# Patient Record
Sex: Female | Born: 1995 | Race: Black or African American | Marital: Single | Smoking: Never smoker
Health system: Northeastern US, Academic
[De-identification: ages and names within clinical notes are randomized; demographics above are authoritative.]

## PROBLEM LIST (undated history)

## (undated) DIAGNOSIS — F419 Anxiety disorder, unspecified: Secondary | ICD-10-CM

## (undated) DIAGNOSIS — F32A Depression, unspecified: Secondary | ICD-10-CM

## (undated) DIAGNOSIS — D649 Anemia, unspecified: Secondary | ICD-10-CM

## (undated) HISTORY — DX: Anxiety disorder, unspecified: F41.9

## (undated) HISTORY — DX: Depression, unspecified: F32.A

---
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
## 2015-01-09 ENCOUNTER — Emergency Department
Admission: EM | Admit: 2015-01-09 | Disposition: A | Discharge status: Home / Self Care | Payer: Self-pay | Source: Ambulatory Visit | Attending: Emergency Medicine | Admitting: Emergency Medicine

## 2015-01-09 ENCOUNTER — Encounter: Payer: Self-pay | Admitting: Emergency Medicine

## 2015-01-09 LAB — PLASMA PROF 7 (ED ONLY)
Anion Gap,PL: 20 — ABNORMAL HIGH (ref 7–16)
CO2,Plasma: 20 mmol/L (ref 20–28)
Chloride,Plasma: 103 mmol/L (ref 96–108)
Creatinine: 0.78 mg/dL (ref 0.50–1.00)
GFR,Black: 127 *
GFR,Caucasian: 110 *
Glucose,Plasma: 115 mg/dL — ABNORMAL HIGH (ref 60–99)
Potassium,Plasma: 3.5 mmol/L (ref 3.4–4.7)
Sodium,Plasma: 143 mmol/L (ref 133–145)
UN,Plasma: 9 mg/dL (ref 6–20)

## 2015-01-09 LAB — CBC AND DIFFERENTIAL
Baso # K/uL: 0 10*3/uL (ref 0.0–0.1)
Basophil %: 0 %
Eos # K/uL: 0.1 10*3/uL (ref 0.0–0.4)
Eosinophil %: 0.9 %
Hematocrit: 40 % (ref 34–45)
Hemoglobin: 13.5 g/dL (ref 11.2–15.7)
Lymph # K/uL: 7.2 10*3/uL — ABNORMAL HIGH (ref 1.2–3.7)
Lymphocyte %: 50 %
MCH: 29 pg/cell (ref 26–32)
MCHC: 34 g/dL (ref 32–36)
MCV: 85 fL (ref 79–95)
Mono # K/uL: 0.6 10*3/uL (ref 0.2–0.9)
Monocyte %: 4.3 %
Neut # K/uL: 4.8 10*3/uL (ref 1.6–6.1)
Nucl RBC # K/uL: 0 10*3/uL (ref 0.0–0.0)
Nucl RBC %: 0 /100 WBC (ref 0.0–0.2)
Platelets: 231 10*3/uL (ref 160–370)
RBC: 4.7 MIL/uL (ref 3.9–5.2)
RDW: 12.6 % (ref 11.7–14.4)
Seg Neut %: 37.9 %
WBC: 12.7 10*3/uL — ABNORMAL HIGH (ref 4.0–10.0)

## 2015-01-09 LAB — DIFF MANUAL
Diff Based On: 116 CELLS
React Lymph %: 7 % — ABNORMAL HIGH (ref 0–6)

## 2015-01-09 LAB — ETHANOL: Ethanol: 243 mg/dL

## 2015-01-09 MED ORDER — SODIUM CHLORIDE 0.9 % IV BOLUS *I*
1000.0000 mL | Freq: Once | Status: AC
Start: 2015-01-09 — End: 2015-01-09
  Administered 2015-01-09: 1000 mL via INTRAVENOUS

## 2015-01-09 NOTE — ED Provider Notes (Addendum)
History     Chief Complaint   Patient presents with    Unresponsive    Alcohol Intoxication     HPI Comments: Nichole Carter is a 19 y.o.  female with no pertinent past medical history who presents in the company of a friend who says that she drank a Four Loco, which may have been spiked with additional vodka.  She was initially brought to the critical care bay because she had a systolic blood pressure in the 70s on arrival.  The patient arrives vomiting.  She is minimally responsive.  She is protecting her airway.  She is moving all extremities equally.      History provided by:  EMS personnel  Language interpreter used: No    Is this ED visit related to civilian activity for income:  Not work related    History reviewed. No pertinent past medical history.     History reviewed. No pertinent past surgical history.  Family History   Problem Relation Age of Onset    Diabetes Mother     No Known Problems Father     High cholesterol Paternal Aunt     Diabetes Paternal Aunt        Social History    reports that she has never smoked. She does not have any smokeless tobacco history on file. She reports that she currently engages in sexual activity and has had female partners. She reports using the following methods of birth control/protection: Condom and Injection. She reports that she does not drink alcohol or use illicit drugs.    Living Situation     Questions Responses    Patient lives with Family    Homeless     Caregiver for other family member     External Services     Employment     Domestic Violence Risk           Problem List     Patient Active Problem List   Diagnosis Code    Refractive Error H52.6    Depo-Provera contraceptive status Z30.40       Review of Systems   Review of Systems   Unable to perform ROS: Mental status change       Physical Exam       ED Triage Vitals   BP Heart Rate Heart Rate (via Pulse Ox) Resp Temp Temp src SpO2 O2 Device O2 Flow Rate   01/09/15 0201 01/09/15 0158 01/09/15 0225  01/09/15 0201 01/09/15 0158 01/09/15 0551 01/09/15 0225 01/09/15 0225 --   72/45 88 57 14 35.9 C (96.6 F) TEMPORAL 100 % None (Room air)       Weight           01/09/15 0158           54.4 kg (120 lb)               Physical Exam   Constitutional:   The patient appears intoxicated.  She is vomiting.  She moans when stimulated.   HENT:   Head: Normocephalic and atraumatic.   Eyes: Conjunctivae and EOM are normal.   Neck: Neck supple.   Cardiovascular: Regular rhythm.    Pulmonary/Chest: Effort normal.   Abdominal: She exhibits no distension.   Skin: Skin is warm and dry.   Nursing note and vitals reviewed.      Medical Decision Making        Initial Evaluation:  ED First Provider Contact     Date/Time Event  User Comments    01/09/15 0212 ED Provider First Contact Thermon LeylandNORTON, DYLAN Initial Face to Face Provider Contact        Patient seen by me on arrival date of 01/09/2015 at at time of arrival  1:55 AM.  Initial face to face evaluation time noted above may be discrepant due to patient acuity and delay in documentation.    Assessment:  19 y.o.female comes to the ED with likely alcohol intoxication.  She has no signs of trauma.  She does appear intoxicated, but is protecting her airway.  There has been some vomiting.  She was noted to initially be hypotensive at triage, but her blood pressure was repeated and her systolic is above 100.    Differential Diagnosis includes alcohol intoxication, electrolyte disorder, dehydration, hypoglycemia, anemia.                      Plan: ED 7, CBC, alcohol level.    Disposition: Will reassess when sober.    ED Course: Initial alcohol level 243. Pt slept comfortably through the night. She was reassessed in the AM. Pt is now clinically sober, alert, oriented. Denies depression / SI. Recalls events and speaking fluently. Pt to be discharged to home.       Galen Manilaylan L Norton, MD         Galen ManilaNorton, Dylan L, MD  Resident  01/09/15 [phone]    Resident Attestation:     Patient seen by me on arrival  date of 01/09/2015 at 0329    History:   I reviewed this patient, reviewed the resident's note and agree.  Exam:   I examined this patient, reviewed the resident's note and agree.    Decision Making:   I discussed with the resident his/her documented decision making  and agree.      Author Tobie PoetJoseph Shykeria Sakamoto, MD           Tobie PoetKonwinski, Burley [place]ka, MD  01/11/15 [phone]

## 2015-01-09 NOTE — ED Triage Notes (Signed)
Patient was at friends house and was drinking unknown quantity of alcohol. Patient MHA'd for intoxication. Unknown drug use.        Triage Note   [name], RN

## 2015-01-09 NOTE — ED Notes (Signed)
Patient verbalizes understanding of discharge instructions. Given a list of detox facilities. All questions/concerns addressed. IV removed, appropriate clothing in place, VSS, AOx4, tolerating PO. Patient ambulated out on own, stable while ambulatory. Has an appropriate ride home with family. Given access to MyChart so patient can review admission results and future appointments.

## 2015-01-09 NOTE — ED Notes (Signed)
Pt brought in by ems under MHA, reportedly consuming etoh/4 LOKO? ? Thc. Pt arrives moaning, making hissing sounds, spitting phlegm out on floor. Pt cold and wet , wrapped in a wet curtain, shirt/bra and 1 wet sock. Pt changed into warm gown and green socks and warm blankets

## 2015-01-09 NOTE — ED Notes (Signed)
Pt still lethargic; responding to questions with a growl or meow. When asked to extend arm for blood pressure, pt growled and writer had to extend arm for pt. Will continue to monitor and treat per orders.

## 2015-01-09 NOTE — ED Notes (Addendum)
Assumed care of pt at this time. Pt is curled up in a ball in bed in NAD, responds to questions and prods by meowing. Friend states that pt "likes to act like a cat when she is drunk by meowing and hissing". Per friend, pt drank one four loko but other friends "kept refilling the four loko with vodka". Pulse ox 98% on room air, BP 105/82. Will continue to monitor and treat per orders.

## 2015-01-09 NOTE — Discharge Instructions (Signed)
Chemical Dependency Resources      How can I tell if I have an alcohol problem?     Alcohol may be harmful for you if it causes a problem in any part of your life. Following are signs that you may have a drinking problem or are alcohol dependent.     Blacking out or forgetting where you were or what you were doing  Drinking to get drunk. Or, feeling like you need to drink more to get the same feeling or "buzz"  Drinking to decrease pain or stress  Drinking more than you had expected to drink  Drinking in a pattern. For example, every day or every week at the same time  Suffering from the effects of alcohol on your daily function  Drinking starts to take over and causes problems in your daily life, such as not showing up for work or driving when you are drunk  Trying to hide how much you drink  Feeling guilty or angry when someone says something about your drinking  Seeing or hearing things that are not there (hallucinating)  Having major personality changes when you drink  Planning activities around drinking  Experiencing seizures (convulsions)  Shaking of your hands if you have not had a drink for a while  Sleeping problems or bad dreams  Sweating, nervousness, confusion, or depression  Thinking a lot about drinking  Trouble having erections       What?s the harm?     Not all drinking is harmful. You may have heard that regular light to moderate drinking  (from ½ drink a day up to 1 drink a day for women and 2 for men) can even be good  for the heart. With at-risk or heavy drinking, however, any potential benefits are  outweighed by greater risks.     Injuries. Drinking too much increases your chances of being injured or even killed.  Alcohol is a factor, for example, in about 60% of fatal burn injuries, drownings, and  homicides; 50% of severe trauma injuries and sexual assaults; and 40% of fatal  motor vehicle crashes, suicides, and fatal falls.     Health problems. Heavy drinkers have a greater risk of liver  disease, heart disease,  sleep disorders, depression, stroke, bleeding from the stomach, sexually transmitted  infections from unsafe sex, and several types of cancer. They may also have problems  managing diabetes, high blood pressure, and other conditions.     Birth defects. Drinking during pregnancy can cause brain damage and other serious  problems in the baby. Because it is not yet known whether any amount of alcohol is  safe for a developing baby, women who are pregnant or may become pregnant should  not drink.     Alcohol use disorders. Generally known as alcoholism and alcohol abuse, alcohol  use disorders are medical conditions that doctors can diagnose when a patient?s  drinking causes distress or harm. In the [place], about 18 million people have  an alcohol use disorder.          Wellness Hints:     Be honest and open about your alcohol use with your family, close friends, and health care professionals. Ask for and accept their help.  Avoid persons who use and/or abuse alcohol and who try to get you to drink alcohol.  Get the help of a counselor and keep regular appointments.  Eat a normal, well-balanced diet, drink six to eight glasses of water a day, and   get plenty of rest.  Replace social activities associated with drinking alcohol with those that do not involve alcohol.  Consider cutting down on the amount of alcohol you drink and how often you drink it.     Resources for Drug and/or Alcohol Treatment   http://dasis3.samhsa.gov/          DETOX FACILITIES:     [facility] ([place])  [address]th floor) [place] [address]  [phone]   [facility] ([place]    [address] [place] [address]  [phone]   [facility] ([place] or Straight Medicaid only)  [address] [place] [address]  [phone]   [facility] ([facility], Alcohol Only)  [address] [place] [address]  [phone]    [facility] ([facility]  Outpatient Only, Alcohol Only)  [address] [place] [address]  [phone]    [facility]  [address] [place] [address]  [phone]      INPATIENT PROGRAMS:     [place]    [address] [place] [address] ([facility] [facility])  [phone]    [place]  [address] [place] [address]  ([phone] option [phone]   [facility]  [address] [place] [address]  (Facility is affiliated with [facility]. Referral must be sent to [facility] who will then forward information to this location.)  [phone]   [facility] ([facility] [place])  [address] [place] [address]  [phone]   [facility] ([place]) - [facility]-   [address] [place] [address]  Facility is affiliated with [facility]. Referral must be sent to [facility] who will then forward information to this location.)  [phone]   [facility]    [address] [place] [address] (located on [facility])  [phone]     [facility] ([facility])  [address] [place] [address]  [phone]     [facility] ([place])    [address] [place] [address]  [phone]            OUTPATIENT PROGRAMS:     [facility]  [address] [place] [address]  [phone]    [facility] ([place]  [address] [place] [address]  [phone]   [place] [facility]  [address] [place] [address] ([facility] [facility])  [address] [place] [address]  [phone]  [phone]   [facility] (Monday- Thursday)  [address] [place] [address]  [phone]   [facility]  [address] [place] [address]  [address] [place] [address] (Spanish speaking available)  [address] [place]  [phone]  (indicate location when calling main intake line)     [facility] - [facility]  [address] [place] [address]  [phone]   [facility] ([place])  [address] [place] [address]  [phone]   [facility] (@ [facility])  [address] [place]  [address]  [phone]   [facility]  [address] [place]  [address]  [phone]   [facility]   [address]    [place] [address]    [address] [place] [address]    [address] [place] [address] ([place])   [phone]  (indicate location when calling main intake line)    [facility]  [address] [place] [address]  [phone]         ADDITIONAL RESOURCES:     Al-Anon/Al-Teen  [phone]    Alcohol Abuse & Addiction Information  1-800-234-0420    Alcohol Anonymous  [phone]    Drug Abuse Information Line  1-800-522-5353    Drug and Alcohol Council  467-2230    LifeLine  275-5151 or 211    [place] [facility] on Alcohol & Substance Abuse ([place])  243-9210    Narcotics Anonymous Hotline  234-7889    [facility]  [phone] ext [phone]          VETERANS ADMINISTRATION:     [place]  [address] [place] [address]  [phone]     [place]  No CD Treatment    [place]  [address] [place] [address]  [phone]     [place]  [address] [place] [address]  [phone]     [place]  [address] [place] [address]  [phone]     [facility]  [address] [place] [address]  *only offers intake appointment if veteran is unable to be seen at another facility. Will refer out for treatment after intake is completed.   [phone]   [place]  No CD treatment     [facility]    [address] [place] [address]

## 2015-01-09 NOTE — ED Notes (Signed)
ED RN INTERN ATTESTATION       I Nichole CanaryJaime E Golden, RN (RN) reviewed the following charting information by the RN intern: Nichole Carter       Nursing Assessments  Medications  Plan of Care  Teaching   Notes    In the chart of Nichole Carter [age] y.o. female) and attest to the charting being accurate.

## 2015-01-12 ENCOUNTER — Emergency Department
Admission: EM | Admit: 2015-01-12 | Disposition: A | Discharge status: Home / Self Care | Payer: Self-pay | Source: Ambulatory Visit

## 2015-01-12 ENCOUNTER — Encounter: Payer: Self-pay | Admitting: Emergency Medicine

## 2015-01-12 LAB — POCT URINALYSIS DIPSTICK
Glucose,UA POCT: NORMAL
Ketones,UA POCT: NEGATIVE
Leuk Esterase,UA POCT: 1 — AB
Lot #: 11517302
Nitrite,UA POCT: POSITIVE — AB
PH,UA POCT: 7 (ref 5–8)

## 2015-01-12 LAB — URINE MICROSCOPIC (IQ200)

## 2015-01-12 LAB — POCT URINE PREGNANCY
Exp date: 3
Lot #: 119119

## 2015-01-12 LAB — VAGINITIS SCREEN: DNA PROBE: Vaginitis Screen:DNA Probe: POSITIVE

## 2015-01-12 LAB — URINALYSIS REFLEX TO CULTURE
Ketones, UA: NEGATIVE
Nitrite,UA: NEGATIVE
Protein,UA: 25 mg/dL — AB
Specific Gravity,UA: 1.014 (ref 1.001–1.030)
pH,UA: 7 (ref 5.0–8.0)

## 2015-01-12 LAB — HM HIV SCREENING OFFERED

## 2015-01-12 LAB — HIV 1&2 ANTIGEN/ANTIBODY: HIV 1&2 ANTIGEN/ANTIBODY: NONREACTIVE

## 2015-01-12 MED ORDER — AZITHROMYCIN 250 MG PO TABS *I*
1000.0000 mg | ORAL_TABLET | Freq: Once | ORAL | Status: AC
Start: 2015-01-12 — End: 2015-01-12
  Administered 2015-01-12: 1000 mg via ORAL
  Filled 2015-01-12: qty 4

## 2015-01-12 MED ORDER — LIDOCAINE HCL 1 % IJ SOLN *I*
INTRAMUSCULAR | Status: AC
Start: 2015-01-12 — End: 2015-01-12
  Administered 2015-01-12: 200 mg
  Filled 2015-01-12: qty 20

## 2015-01-12 MED ORDER — NITROFURANTOIN MONOHYD MACRO 100 MG PO CAPS *I*
100.0000 mg | ORAL_CAPSULE | Freq: Two times a day (BID) | ORAL | 0 refills | Status: AC
Start: 2015-01-12 — End: 2015-01-17

## 2015-01-12 MED ORDER — CEFTRIAXONE SODIUM 250 MG IJ SOLR *I*
250.0000 mg | Freq: Once | INTRAMUSCULAR | Status: AC
Start: 2015-01-12 — End: 2015-01-12
  Administered 2015-01-12: 250 mg via INTRAMUSCULAR
  Filled 2015-01-12: qty 250

## 2015-01-12 NOTE — Discharge Instructions (Signed)
Follow up with pcp    Stay hydrated    Take antibiotics as directed    You were evaluated today and had cultures sent to our lab for testing, which will take on average 48 hours to result. We are to notify you with positive findings. It is important that you do not have sexual intercourse or contact until you receive our results, and if positive, that your partner(s) is/are tested. If you would like to follow up on your own, you can access the information through MyChart or call [phone]). Your message will be returned within 24 hours.
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
## 2015-01-12 NOTE — ED Triage Notes (Signed)
States mid abd pain with n/v that began on 01-09-15. Also wants STD checks        Triage Note   Golden HurterJeffery S Betzabeth Derringer, RN

## 2015-01-13 LAB — CHLAMYDIA PLASMID DNA AMPLIFICATION: Chlamydia Plasmid DNA Amplification: 0

## 2015-01-13 LAB — N. GONORRHOEAE DNA AMPLIFICATION: N. gonorrhoeae DNA Amplification: 0

## 2015-01-13 LAB — AEROBIC CULTURE

## 2015-01-26 ENCOUNTER — Ambulatory Visit: Payer: Self-pay | Admitting: Family Medicine

## 2015-02-09 ENCOUNTER — Ambulatory Visit: Payer: Self-pay | Admitting: Family Medicine

## 2015-03-14 ENCOUNTER — Emergency Department
Admission: EM | Admit: 2015-03-14 | Disposition: A | Discharge status: Home / Self Care | Payer: Self-pay | Source: Ambulatory Visit

## 2015-03-14 ENCOUNTER — Encounter: Payer: Self-pay | Admitting: Emergency Medicine

## 2015-03-14 LAB — POCT URINALYSIS DIPSTICK
Glucose,UA POCT: NORMAL
Ketones,UA POCT: NEGATIVE
Leuk Esterase,UA POCT: NEGATIVE
Lot #: 17822902
Nitrite,UA POCT: NEGATIVE
PH,UA POCT: 5 (ref 5–8)

## 2015-03-14 LAB — BASIC METABOLIC PANEL
Anion Gap: 13 (ref 7–16)
CO2: 25 mmol/L (ref 20–28)
Calcium: 9.4 mg/dL (ref 9.0–10.4)
Chloride: 104 mmol/L (ref 96–108)
Creatinine: 0.86 mg/dL (ref 0.50–1.00)
GFR,Black: 113 *
GFR,Caucasian: 98 *
Glucose: 106 mg/dL — ABNORMAL HIGH (ref 60–99)
Lab: 12 mg/dL (ref 6–20)
Potassium: 4 mmol/L (ref 3.3–5.1)
Sodium: 142 mmol/L (ref 133–145)

## 2015-03-14 LAB — BLOOD BANK HOLD LAVENDER

## 2015-03-14 LAB — RUQ PANEL (ED ONLY)
ALT: 17 U/L (ref 0–35)
AST: 21 U/L (ref 0–35)
Albumin: 4.4 g/dL (ref 3.5–5.2)
Alk Phos: 94 U/L (ref 50–130)
Amylase: 113 U/L — ABNORMAL HIGH (ref 28–100)
Bilirubin,Direct: <0.2 mg/dL (ref 0.0–0.3)
Bilirubin,Total: 0.4 mg/dL (ref 0.0–1.2)
Globulin: 2.7 g/dL (ref 2.7–4.3)
Lipase: 28 U/L (ref 13–60)
Total Protein: 7.1 g/dL (ref 6.3–7.7)

## 2015-03-14 LAB — CBC AND DIFFERENTIAL
Baso # K/uL: 0 10*3/uL (ref 0.0–0.1)
Basophil %: 0.3 %
Eos # K/uL: 0.1 10*3/uL (ref 0.0–0.4)
Eosinophil %: 0.9 %
Hematocrit: 44 % (ref 34–45)
Hemoglobin: 14.4 g/dL (ref 11.2–15.7)
IMM Granulocytes #: 0 10*3/uL (ref 0.0–0.1)
IMM Granulocytes: 0.3 %
Lymph # K/uL: 2.6 10*3/uL (ref 1.2–3.7)
Lymphocyte %: 38.3 %
MCH: 28 pg (ref 26–32)
MCHC: 33 g/dL (ref 32–36)
MCV: 85 fL (ref 79–95)
Mono # K/uL: 0.4 10*3/uL (ref 0.2–0.9)
Monocyte %: 6.3 %
Neut # K/uL: 3.6 10*3/uL (ref 1.6–6.1)
Nucl RBC # K/uL: 0 10*3/uL (ref 0.0–0.0)
Nucl RBC %: 0 /100 WBC (ref 0.0–0.2)
Platelets: 236 10*3/uL (ref 160–370)
RBC: 5.2 MIL/uL (ref 3.9–5.2)
RDW: 12.7 % (ref 11.7–14.4)
Seg Neut %: 53.9 %
WBC: 6.7 10*3/uL (ref 4.0–10.0)

## 2015-03-14 LAB — PREGNANCY TEST, SERUM: Preg,Serum: NEGATIVE

## 2015-03-14 MED ORDER — OMEPRAZOLE 20 MG PO CPDR *I*
20.0000 mg | DELAYED_RELEASE_CAPSULE | Freq: Every day | ORAL | 0 refills | Status: DC
Start: 2015-03-14 — End: 2015-11-01

## 2015-03-14 MED ORDER — KETOROLAC TROMETHAMINE 30 MG/ML IJ SOLN *I*
15.0000 mg | Freq: Once | INTRAMUSCULAR | Status: AC
Start: 2015-03-14 — End: 2015-03-14
  Administered 2015-03-14: 15 mg via INTRAVENOUS
  Filled 2015-03-14: qty 1

## 2015-03-14 MED ORDER — ACETAMINOPHEN 500 MG PO TABS *I*
500.0000 mg | ORAL_TABLET | Freq: Four times a day (QID) | ORAL | 0 refills | Status: AC | PRN
Start: 2015-03-14 — End: 2015-04-13

## 2015-03-14 MED ORDER — FAMOTIDINE (PF) 20 MG/2ML IV SOLN *I*
20.0000 mg | Freq: Once | INTRAVENOUS | Status: AC
Start: 2015-03-14 — End: 2015-03-14
  Administered 2015-03-14: 20 mg via INTRAVENOUS
  Filled 2015-03-14: qty 2

## 2015-03-14 MED ORDER — ALUM & MAG HYDROXIDE-SIMETH 200-200-20 MG/5ML PO SUSP *I*
30.0000 mL | Freq: Once | ORAL | Status: AC
Start: 2015-03-14 — End: 2015-03-14
  Administered 2015-03-14: 30 mL via ORAL
  Filled 2015-03-14: qty 30

## 2015-03-14 NOTE — Discharge Instructions (Signed)
You were seen in the ED for abdominal pain.  No acute findings during ED evaluation.    You should follow up with a provider from the list below.    You should continue to take your home medications as prescribed.    Return to the ED should you have any further acute medical problems or any worsening symptoms.    Should you have any further questions about your care or evaluation you should call or make an appointment with your primary physician to review them.    If you do not have a primary care doctor, the following physicians are currently accepting new patients:    [facility]    [facility] [place]  [address]  [phone]  Bertis Ruddyichard Dent, MD (FM)  Accepting Pediatric Patients Only  Wyline MoodKatherine Eisenberg, MD, PhD [facility](FM)  Accepting Pediatric and Obstetric Patients Only  Quentin AngstScott Hartman, MD (FM, [place])  Accepting Patients of All Ages  Carole CivilAssaf Yosha, MD ([place], [place])  Accepting Pediatric and Obstetric Patients Only    [facility]  [address], [address]  [phone]  Alfredia FergusonAlexandria Harmon, MD (Peds)     [facility]  [address]  [phone]  Nydia BoutonMichael Gavin, MD (IM/Peds)  Accepting Pediatric Patients Only  Emilio MathAnne Ryan, MD (MP)  Juliet RudeNicole Vavrina, DO (FM)    [place]    [facility]  [address], [address]  [phone]  Madison HickmanKathleen Donahue, MD (FM)   Accepting Pediatric Patients Only  Delanna Ahmadiarla Schwartz, MD (IM/Peds)  Margreta JourneyMarianne Taylor, MD (IM/Peds)  Accepting Pediatric Patients Only                     [facility]  [address]  [phone]  Accepting Pediatric Patients Only    [facility]  [address]  [phone]    [facility]  [address], [address]  [phone]  Dondra Spryebecca Lavender, MD (FM)  Marin OlpKarolina Lis-Hyjek, MD (FM)  Lavonne ChickStephen Lurie, MD, PhD (FM)  Sindy GuadeloupeNatercia Rodrigues, MD (FM)  Jeanmarie Plantobbyn Upham, MD [facility](FM)    [facility]  [address]  [phone]  Kenton KingfisherJames Alexander, MD (FM)  Carmin RichmondSurag Patel, MD (FM)  Dolphus Jennyraig Sillick, MD (FM)  Courtney HeysAngela St. Clair, DO [facility](FM)    [facility]  [address]   [phone]  Jacqulyn DuckingJesse Schenendorf, MD (IM)    [facility]  [address]  Ambulatory Care Facility - 5th floor  [phone]    [facility]    [facility]  [address]  [phone]  Valeta HarmsSasha Nelson, MD (IM/Peds)  Accepting Pediatric Patients Only   [facility]   [place]  [address], [address]   [phone]  Janeal Holmesrew Emerson, MD (FM)    [facility]   [place]  [address]  [phone]  Josefine ClassScott Wilson, MD (FM)    [facility]  [phone]   [phone]  Eustace QuailAdrian Ashdown, MD (FM)    [facility]     [facility]   [address]   [phone]   Yolanda MangesAlex Fahoury, MD (IM)     [facility]  [address]  [phone]  Rosetta Posner, MD (FM)  Norvel Richards, DO [facility])  Royanne Foots, DO ([place])  Charlesetta Shanks, MD [facility])    [place]  [phone]   Didem Miraloglu, MD (IM)       FM: Family Medicine   IM: Internal Medicine  IM/Peds: Internal Medicine & Pediatrics  MP: Family Medicine & Pediatrics  OB Obstetrics  Peds: Pediatrics   For physician referral services, please call [phone] [email or web address]

## 2015-03-14 NOTE — ED Provider Notes (Signed)
History     Chief Complaint   Patient presents with    Abdominal Pain     Pt presents to the ED with c/o Mid epigastric pain, pt states "it feels like a punch, or burning" x3 days. denies any N/V/D.     HPI Comments: 20 year old female presents to the ED with complaints of abdominal pain.  Patient states for the last 3 days she's been having upper abdominal pain.  Patient has been constant.  Describes it as a burning pain.  Poor appetite due to pain.  Patient denies fevers, chills, nausea, vomiting, diarrhea.  Patient states that she is currently on her menstrual cycle.      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     No past surgical history on file.  Family History   Problem Relation Age of Onset    Diabetes Mother     No Known Problems Father     High cholesterol Paternal Aunt     Diabetes Paternal Aunt        Social History    reports that she has never smoked. She does not have any smokeless tobacco history on file. She reports that she drinks alcohol. She reports that she currently engages in sexual activity and has had female partners. She reports using the following methods of birth control/protection: Condom and Injection. She reports that she does not use illicit drugs.    Living Situation     Questions Responses    [facility]    Homeless     Caregiver for other family member     External Services     Employment     Domestic Violence Risk           Problem List     Patient Active Problem List   Diagnosis Code    Refractive Error H52.6    Depo-Provera contraceptive status Z30.40       Review of Systems   Review of Systems   Constitutional: Negative for chills and fever.   HENT: Negative for congestion.    Eyes: Negative for visual disturbance.   Respiratory: Negative for cough and shortness of breath.    Cardiovascular: Negative for chest pain.    Gastrointestinal: Positive for abdominal pain. Negative for blood in stool, diarrhea, nausea and vomiting.   Genitourinary: Positive for vaginal bleeding. Negative for dysuria, frequency and hematuria.   Musculoskeletal: Negative for arthralgias and back pain.   Neurological: Negative for dizziness, weakness, light-headedness, numbness and headaches.   All other systems reviewed and are negative.      Physical Exam     ED Triage Vitals   BP Heart Rate Heart Rate (via Pulse Ox) Resp Temp Temp src SpO2 O2 Device O2 Flow Rate   03/14/15 2040 03/14/15 2040 -- 03/14/15 2040 03/14/15 2040 03/14/15 2040 03/14/15 2040 -- --   111/61 80  18 36 C (96.8 F) TEMPORAL 97 %        Weight           03/14/15 2040           56.7 kg (125 lb)                    Physical Exam   Constitutional: She is oriented to person, place, and time.  She appears well-developed and well-nourished.   HENT:   Head: Normocephalic and atraumatic.   Mouth/Throat: Oropharynx is clear and moist. No oropharyngeal exudate.   Eyes: Conjunctivae are normal. Pupils are equal, round, and reactive to light.   Neck: Normal range of motion. Neck supple.   Cardiovascular: Normal rate, regular rhythm and normal heart sounds.    No murmur heard.  Pulmonary/Chest: Effort normal and breath sounds normal. No respiratory distress. She has no wheezes. She has no rales.   Abdominal: Soft. Bowel sounds are normal. She exhibits no distension and no mass. There is tenderness in the right upper quadrant and epigastric area. There is no rebound and no guarding. No hernia.   Lymphadenopathy:     She has no cervical adenopathy.   Neurological: She is alert and oriented to person, place, and time.   Skin: Skin is warm and dry.   Psychiatric: She has a normal mood and affect.   Nursing note and vitals reviewed.      Medical Decision Making      Amount and/or Complexity of Data Reviewed  Clinical lab tests: ordered and reviewed  Tests in the radiology section of CPT: ordered and  reviewed  Decide to obtain previous medical records or to obtain history from someone other than the patient: yes  Review and summarize past medical records: yes        Initial Evaluation:  ED First Provider Contact     Date/Time Event User Comments    03/14/15 2053 ED Provider First Contact Joachim Carton Initial Face to Face Provider Contact          Patient seen by me on arrival date of 03/14/2015 at at time of arrival  8:29 PM.  Initial face to face evaluation time noted above may be discrepant due to patient acuity and delay in documentation.    Assessment:  20 y.o.female comes to the ED with Abdominal pain the pain is to her upper abdomen.  Patient does have pain epigastrium and right upper quadrant.    Differential Diagnosis includes cholelithiasis, cholecystitis, pancreatitis, hepatobiliary disease, gastritis, GERD, peptic ulcer disease, gastroenteritis                      Plan:   CBC, BMP, right upper quadrant panel, pregnancy  IV fluids, Toradol, Maalox, Pepcid    Labs Reviewed   BASIC METABOLIC PANEL - Abnormal; Notable for the following:        Result Value    Glucose 106 (*)     All other components within normal limits   RUQ PANEL (ED ONLY) - Abnormal; Notable for the following:     Amylase 113 (*)     All other components within normal limits   CBC AND DIFFERENTIAL   PREGNANCY TEST, SERUM   BLOOD BANK HOLD LAVENDER   POCT URINALYSIS DIPSTICK     US abdomen limited single quad or f/u specify   Final Result   IMPRESSION:        Unremarkable right upper quadrant ultrasound. No sonographic evidence    of cholelithiasis, cholecystitis, or biliary dilatation.        Labs and imaging unremarkable.  Prescription for Tylenol and Prilosec sent. Will discharge patient home with PCP follow-up.  Patient educated on reasons to return to the ED.  Patient understands and agrees with plan.    Gavyn Zoss ALI, PA    Supervising physician Dr. Berton Bon was immediately available  Arlie Solomons, [place]  03/14/15 [phone]

## 2015-03-14 NOTE — ED Notes (Signed)
patient removed belly Nichole Carter

## 2015-04-03 ENCOUNTER — Emergency Department
Admission: EM | Admit: 2015-04-03 | Discharge status: Against Medical Advice | Payer: Self-pay | Source: Ambulatory Visit | Attending: Emergency Medicine | Admitting: Emergency Medicine

## 2015-04-03 ENCOUNTER — Other Ambulatory Visit: Payer: Self-pay | Admitting: Gastroenterology

## 2015-04-03 LAB — HM HIV SCREENING OFFERED

## 2015-04-03 NOTE — ED Notes (Signed)
Providers notified that patient was threatening to walk out if not seen soon

## 2015-04-03 NOTE — ED Notes (Signed)
Patient walked out of ed
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
## 2015-04-11 LAB — EKG 12-LEAD
P: 70 degrees
QRS: 90 degrees
Rate: 99 {beats}/min
Statement: BORDERLINE
T: 54 degrees

## 2015-06-15 ENCOUNTER — Ambulatory Visit: Payer: Medicaid Other | Attending: Family Medicine | Admitting: Family Medicine

## 2015-06-15 ENCOUNTER — Encounter: Payer: Self-pay | Admitting: Family Medicine

## 2015-06-15 ENCOUNTER — Other Ambulatory Visit
Admission: RE | Admit: 2015-06-15 | Discharge: 2015-06-15 | Disposition: A | Discharge status: Home / Self Care | Payer: Self-pay | Source: Ambulatory Visit | Attending: Family Medicine | Admitting: Family Medicine

## 2015-06-15 VITALS — BP 98/51 | HR 69 | Temp 96.4°F | Ht 67.0 in | Wt 132.0 lb

## 2015-06-15 DIAGNOSIS — M79646 Pain in unspecified finger(s): Secondary | ICD-10-CM | POA: Insufficient documentation

## 2015-06-15 DIAGNOSIS — Z309 Encounter for contraceptive management, unspecified: Secondary | ICD-10-CM | POA: Insufficient documentation

## 2015-06-15 DIAGNOSIS — H526 Other disorders of refraction: Secondary | ICD-10-CM | POA: Insufficient documentation

## 2015-06-15 DIAGNOSIS — N760 Acute vaginitis: Secondary | ICD-10-CM

## 2015-06-15 DIAGNOSIS — Z0000 Encounter for general adult medical examination without abnormal findings: Secondary | ICD-10-CM | POA: Insufficient documentation

## 2015-06-15 DIAGNOSIS — N921 Excessive and frequent menstruation with irregular cycle: Secondary | ICD-10-CM

## 2015-06-15 DIAGNOSIS — M25519 Pain in unspecified shoulder: Secondary | ICD-10-CM | POA: Insufficient documentation

## 2015-06-15 LAB — COMPREHENSIVE METABOLIC PANEL
ALT: 16 U/L (ref 0–35)
AST: 19 U/L (ref 0–35)
Albumin: 4.7 g/dL (ref 3.5–5.2)
Alk Phos: 102 U/L (ref 50–130)
Anion Gap: 18 — ABNORMAL HIGH (ref 7–16)
Bilirubin,Total: 0.3 mg/dL (ref 0.0–1.2)
CO2: 23 mmol/L (ref 20–28)
Calcium: 9.4 mg/dL (ref 9.0–10.4)
Chloride: 104 mmol/L (ref 96–108)
Creatinine: 0.9 mg/dL (ref 0.50–1.00)
GFR,Black: 107 *
GFR,Caucasian: 92 *
Glucose: 76 mg/dL (ref 60–99)
Lab: 11 mg/dL (ref 6–20)
Potassium: 4.3 mmol/L (ref 3.3–5.1)
Sodium: 145 mmol/L (ref 133–145)
Total Protein: 6.9 g/dL (ref 6.3–7.7)

## 2015-06-15 LAB — CBC AND DIFFERENTIAL
Baso # K/uL: 0 10*3/uL (ref 0.0–0.1)
Basophil %: 0.3 %
Eos # K/uL: 0.1 10*3/uL (ref 0.0–0.4)
Eosinophil %: 0.8 %
Hematocrit: 37 % (ref 34–45)
Hemoglobin: 12.1 g/dL (ref 11.2–15.7)
IMM Granulocytes #: 0 10*3/uL (ref 0.0–0.1)
IMM Granulocytes: 0.3 %
Lymph # K/uL: 2.4 10*3/uL (ref 1.2–3.7)
Lymphocyte %: 30.8 %
MCH: 28 pg/cell (ref 26–32)
MCHC: 33 g/dL (ref 32–36)
MCV: 85 fL (ref 79–95)
Mono # K/uL: 0.4 10*3/uL (ref 0.2–0.9)
Monocyte %: 5.4 %
Neut # K/uL: 4.9 10*3/uL (ref 1.6–6.1)
Nucl RBC # K/uL: 0 10*3/uL (ref 0.0–0.0)
Nucl RBC %: 0.1 /100 WBC (ref 0.0–0.2)
Platelets: 226 10*3/uL (ref 160–370)
RBC: 4.3 MIL/uL (ref 3.9–5.2)
RDW: 12.8 % (ref 11.7–14.4)
Seg Neut %: 62.4 %
WBC: 7.9 10*3/uL (ref 4.0–10.0)

## 2015-06-15 LAB — POCT URINE PREGNANCY
Exp date: 62018
Lot #: 162941

## 2015-06-15 LAB — TSH: TSH: 0.33 u[IU]/mL (ref 0.27–4.20)

## 2015-06-15 LAB — T4, FREE: Free T4: 1.4 ng/dL (ref 0.9–1.7)

## 2015-06-15 LAB — BHCG, QUANT PREGNANCY: BHCG, QUANT PREGNANCY: <1 m[IU]/mL (ref 0–1)

## 2015-06-15 MED ORDER — PARAGARD INTRAUTERINE COPPER IU IUD *I*
1.0000 | INTRAUTERINE_SYSTEM | Freq: Once | INTRAUTERINE | Status: AC
Start: 2015-06-15 — End: 2015-06-16

## 2015-06-15 NOTE — Progress Notes (Signed)
Pt presents today for NPV and ROS    Allergies, medications, social hx and PMH reviewed and Erecord revised    ROS  Three weeks ago fell off skateboard and injured R thumb/L shoulder; Painful ROM persists;    GYN: LMP 5/9;  Sexually active with one female partner with whom she lives;  Menses q30-120 days;  + yellow vaginal discharge;  No pelvic pain;  No dyspareunia; no n/v; nl b/b function;   No hirsutism; IPV screen negative  Mood:  Patient reports mood is stable;  Wishes she could find different job, but "money is good" at present employment;     Exam:    Visit Vitals    BP 98/51 (BP Location: Right arm, Patient Position: Sitting, Cuff Size: adult)    Pulse 69    Temp 35.8 C (96.4 F) (Temporal)    Ht 1.702 m ( )  Comment: actual    Wt 59.9 kg (132 lb)    BMI 20.67 kg/m2       General Appearance:  Alert, cooperative, no distress, appears stated age   Head:  Normocephalic, without obvious abnormality, atraumatic   Eyes:  PERRL, conjunctiva/corneas clear, EOM's intact,   Ears:  Normal TM's and external ear canals, both ears   Nose: Nares normal, septum midline,mucosa normal, no drainage or sinus tenderness   Throat: Lips, mucosa, and tongue normal; teeth and gums normal   Neck: Supple, symmetrical, trachea midline, no adenopathy;  thyroid: not enlarged, symmetric, no tenderness/mass/nodules;    Back:   Symmetric, no curvature,  no CVA tenderness   Lungs:   Clear to auscultation bilaterally, respirations unlabored   Breasts:  No masses or tenderness   Heart:  Regular rate and rhythm, S1 and S2 normal, no murmur, rub, or gallop   Abdomen:   Soft, non-tender,  no masses, no organomegaly   Pelvic: No external lesions normal vagina and cervix;  Yellow thin vaginal d/c in vagina;  Bimanual: no CMT; no masses   Extremities: Painful R thumb and L shoulder   Pulses: Lower extrem2+ and symmetric   Skin: Skin color, texture, turgor normal, no rashes or lesions   Lymph nodes: Cervical, supraclavicular, and axillary  nodes normal   Neurologic: Grossly normal;          GAD score nine    IUD Insertion Note    Nichole Carter is a 20 y.o. female, G0P0000 with a last period of  .  Her last pregnancy was none. Her current method of birth control is condoms: some of the timeShe states her last intercourse was this week.Marland Kitchen         UPT result: negative    Consent:  The procedure risks, benefits, complications and possible alternatives were discussed with the patient. All questions were answered prior to the patient signing the informed consent.    Procedure Details     The patient was placed in the dorsal lithotomy position.  Bimanual exam showed the uterus to be in  a retroverted position.  A speculum was inserted into the vagina, and the cervix prepped with povidone iodine.  A single tooth tenaculum was placed on the anterior lip of the cervix at 2   o'clock. The uterus sounded to 6 cm. Using the applicator, the PARAGARDbIUD was successfully placed and floated to the fundus without difficulty. The string was cut 2 cm from the cervix. Bleeding was noted. The tenaculum was removed without incident. The patient tolerated the procedure well. Blood loss was  minimal.    Lot #: [id number]  Expiration date: *2023  Removal date: 2027-June  Post procedure pain rated as none       Assessment:   Contraceptive management with PARAGARDIUD placement.    Plan:  Post insertion instructions were reviewed with the patient and written information was also provided.    The plan was reviewed with the patient including:  - Monitoring for heavy bleeding, fever, expulsion or severe pain  - Instruction on checking for IUD strings  - Other instruction: none  - The patient will follow up prn      All questions were answered and the patient stated a good understanding of instructions.Pt tolerated procedure well and left office in stable condition    1. Vaginitis  Chlamydia plasmid DNA amplification    N. Gonorrhoeae DNA amplification    Vaginitis screen: DNA probe                                                                              4. Shoulder pain, unspecified chronicity, unspecified laterality  AMB REFERRAL TO ORTHOPEDIC SURGERY   5. Thumb pain, unspecified laterality  AMB REFERRAL TO ORTHOPEDIC SURGERY   6. Encounter for contraceptive management, unspecified contraceptive encounter type  paraGard intrauterine copper device 1 each   7. Metrorrhagia  CBC and differential    Comprehensive metabolic panel    T4, free    TSH    Testosterone, total and free    BHCG, SERUM:     Fu prn  HFM policies and procedures and MYCHART d/w pt

## 2015-06-16 ENCOUNTER — Telehealth: Payer: Self-pay

## 2015-06-16 ENCOUNTER — Other Ambulatory Visit: Payer: Self-pay | Admitting: Family Medicine

## 2015-06-16 DIAGNOSIS — B9689 Other specified bacterial agents as the cause of diseases classified elsewhere: Secondary | ICD-10-CM

## 2015-06-16 DIAGNOSIS — N760 Acute vaginitis: Secondary | ICD-10-CM

## 2015-06-16 LAB — HEPATITIS B SURFACE ANTIBODY
HBV S Ab Quant: >1000 m[IU]/mL
HBV S Ab: POSITIVE

## 2015-06-16 LAB — N. GONORRHOEAE DNA AMPLIFICATION
N. gonorrhoeae DNA Amplification: 0
N. gonorrhoeae DNA Amplification: 0

## 2015-06-16 LAB — HEPATITIS B CORE ANTIBODY, TOTAL: HBV Core Ab: NEGATIVE

## 2015-06-16 LAB — VAGINITIS SCREEN: DNA PROBE
Vaginitis Screen:DNA Probe: POSITIVE — AB
Vaginitis Screen:DNA Probe: POSITIVE — AB

## 2015-06-16 LAB — HIV 1&2 ANTIGEN/ANTIBODY: HIV 1&2 ANTIGEN/ANTIBODY: NONREACTIVE

## 2015-06-16 LAB — CHLAMYDIA PLASMID DNA AMPLIFICATION
Chlamydia Plasmid DNA Amplification: 0
Chlamydia Plasmid DNA Amplification: 0

## 2015-06-16 LAB — HEB B INT

## 2015-06-16 LAB — HEPATITIS B SURFACE ANTIGEN: HBV S Ag: NEGATIVE

## 2015-06-16 LAB — SYPHILIS SCREEN
Syphilis Screen: NEGATIVE
Syphilis Status: NONREACTIVE

## 2015-06-16 LAB — HEPATITIS C ANTIBODY: Hep C Ab: NEGATIVE

## 2015-06-16 MED ORDER — METRONIDAZOLE 500 MG PO TABS *I*
500.0000 mg | ORAL_TABLET | Freq: Two times a day (BID) | ORAL | 0 refills | Status: DC
Start: 2015-06-16 — End: 2015-06-18

## 2015-06-16 NOTE — Telephone Encounter (Signed)
York SpanielSaid that she has been cramping since she had her IUD placed.  Took Tylenol.  Advised that cramping can be normal for up to a few weeks.  Advised that she should take Ibuprofen instead.  Pt. agreeable and reassured.

## 2015-06-18 ENCOUNTER — Other Ambulatory Visit: Payer: Self-pay

## 2015-06-18 DIAGNOSIS — N760 Acute vaginitis: Secondary | ICD-10-CM

## 2015-06-18 DIAGNOSIS — B9689 Other specified bacterial agents as the cause of diseases classified elsewhere: Secondary | ICD-10-CM

## 2015-06-18 MED ORDER — METRONIDAZOLE 500 MG PO TABS *I*
500.0000 mg | ORAL_TABLET | Freq: Two times a day (BID) | ORAL | 0 refills | Status: DC
Start: 2015-06-18 — End: 2015-08-15

## 2015-06-18 NOTE — Telephone Encounter (Signed)
Pt. Left a message on my voice mail last night, asking where we sent her Rx for BV?  Writer returned call this AM and informed her that we sent it to [facility] in [place].  She asked us to [address] [facility] [address].

## 2015-06-19 LAB — LC/MS F&T TESTOSTERONE
TESTOSTERONE,FREE: 0.49 ng/dL (ref 0.06–1.08)
Testosterone,LC-MS/MS: 35 ng/dL (ref 8–60)

## 2015-06-20 ENCOUNTER — Encounter: Payer: Self-pay | Admitting: Family Medicine

## 2015-06-21 ENCOUNTER — Other Ambulatory Visit: Payer: Self-pay | Admitting: Orthopedic Surgery

## 2015-06-21 DIAGNOSIS — M25512 Pain in left shoulder: Secondary | ICD-10-CM

## 2015-06-23 ENCOUNTER — Ambulatory Visit: Payer: Self-pay | Admitting: Orthopedic Surgery

## 2015-06-23 ENCOUNTER — Encounter: Payer: Self-pay | Admitting: Orthopedic Surgery

## 2015-06-23 VITALS — BP 109/59 | Ht 67.0 in | Wt 132.0 lb

## 2015-06-23 DIAGNOSIS — S43002A Unspecified subluxation of left shoulder joint, initial encounter: Secondary | ICD-10-CM

## 2015-06-23 DIAGNOSIS — M25512 Pain in left shoulder: Secondary | ICD-10-CM

## 2015-06-23 NOTE — Progress Notes (Signed)
History:  Nichole Carter is a 20 y.o. that is being seen as a consult request from Dr. Nicolasa DuckingPiotrowski, Nichole LucySuzanne Carter, Nichole Carter for evaluation of her Left shoulder pain.       Nichole Carter  comes to the office today for a new patient evaluation of the left shoulder pain.  They have had pain for a little over a month.  Pain is attributed to a specific injury.  Injured the shoulder while skateboarding on 05/18/2015.  She fell off her skateboard and felt her shoulder "dislocate".  She says she "popped" it back in herself.  She did not seek any further medical attention.  Pain is over the anterior and lateral aspects of the shoulder.  It is worse with reaching overhead and behind the body.  Pain does not radiate. There is mild weakness associated with the pain.   Complains of stiffness. Cannot sleep on the shoulder due to the pain.    Does  complain of feeling of instability in the shoulder, feels shifting at night.  Has not other frank dislocations of the shoulder.   Does not complain of clicking and popping in the shoulder.    Has tried NSAIDs and tylenol for the pain with minimal relief.  No physical therapy.    Denies elbow or wrist pain.  Denies neck pain.  Denies distal paresthesias.     No previous history of injury to this shoulder    The patient is right UE dominant.     Past medical history, past surgical history, medications, allergies, family history, social history, and review of systems were reviewed today and have been documented separately in this encounter.      Physical Examination:  She is in no acute distress.  She is alert and oriented x 3.   Examination of her neck demonstrates normal cervical range of motion with nomidline and no paraspinal tenderness.  There is no evidence of jugular venous distension.  Skin appearance is normal with no evidence of rash or other lesions.  All distal pulses are palpable. Neurovascular exam is normal and unremarkable.    Range of Motion: AROM  The left shoulder demonstrates active  forward elevation to 150 degrees. She externally rotates to 60 degrees. She internally rotates to L1.     Strength:  She has grade 5/5 supraspinatus strength.  She has grade 5/5 infraspinatus strength and grade 5/5 subscapularis strength.      Special tests:  Hawkins test was positive.    Neer test was positive.    O'Brian test was negative.     Apprehension test was positive.    Jobe's test was negative, but did elicit pain in the shoulder.    Palpation:  There is not tenderness at the [facility]. There is not tenderness along the biceps tendon. Distally she is neurovascularly intact. There is not scapular dyskinesia.      There is tenderness to palpation over the parascapular muscular.  There is tenderness to palpation over the medial scapular border.  There  Is not tenderness to palpation over the trapezius.      Imaging: I personally reviewed the patients images. X-Rays demonstrate no degenerative changes.  No dislocation.    Assessment:  Left shoulder subluxation, impingement    Plan: I discussed the diagnosis and the treatment options with the patient.  The patient would like to proceed with a course of physical therapy and NSAIDS.  We will see them back in 6-8 weeks if they fail to improve with  conservative treatment.  Questions were invited and answered to the patients satisfaction.  They will call the office if problems arise sooner.

## 2015-06-24 ENCOUNTER — Encounter: Payer: Self-pay | Admitting: Family Medicine

## 2015-06-24 DIAGNOSIS — M25519 Pain in unspecified shoulder: Secondary | ICD-10-CM | POA: Insufficient documentation

## 2015-06-28 ENCOUNTER — Encounter: Payer: Self-pay | Admitting: Family Medicine

## 2015-06-29 ENCOUNTER — Encounter: Payer: Self-pay | Admitting: Family Medicine

## 2015-06-29 ENCOUNTER — Ambulatory Visit: Payer: Self-pay | Admitting: Family Medicine

## 2015-07-08 ENCOUNTER — Ambulatory Visit: Payer: Self-pay | Admitting: Orthopedic Surgery

## 2015-07-08 ENCOUNTER — Encounter: Payer: Self-pay | Admitting: Orthopedic Surgery

## 2015-07-08 VITALS — BP 120/57

## 2015-07-08 VITALS — BP 128/56 | Ht 67.0 in | Wt 130.1 lb

## 2015-07-08 DIAGNOSIS — M79646 Pain in unspecified finger(s): Secondary | ICD-10-CM

## 2015-07-08 DIAGNOSIS — M2240 Chondromalacia patellae, unspecified knee: Secondary | ICD-10-CM

## 2015-07-08 NOTE — Progress Notes (Signed)
CC: 20 y.o. Female presents with right thumb pain    HPI: The patient is here for the first time.  We appreciate Dr. Nicolasa DuckingPiotrowski allowing us to participate in her care.  The patient fell off of her skateboard on May 9 of 2017. She states that she tripped over her own feet and fell down. As she fell she braced herself on her hands. She admits to injuring her right thumb, shoulder, and knee at that time.  She is currently being seen by another physician in regard to the shoulder and her knee.  She denies any numbness or tingling in her hands. She admits to having pain on both the volar and dorsal aspects of the 1st MCP joint for three days following the incident, but now only complains of pain on the dorsal aspect of the MCP joint, specifically pointing to the medial side. She states that she was icing and taking Ibuprofen for the first few days and since then the swelling has decreased.  However, now even 2 months later she continues to have pain in her thumb.    PMH/SH/FH/Meds/Allergies/ROS:  All pertinent positives and negatives of all histories and greater than 10 ROS have been personally reviewed.    Physical Exam:   Visit Vitals    BP 128/56    Ht 1.702 m (5\' 7" )    Wt 59 kg (130 lb 1.1 oz)    BMI 20.37 kg/m2       Patient is alert and oriented x3 in no acute distress and is seated comfortably. Gait is normal. With inspection of the right thumb, there in no redness and minimal swelling around the thenar emesis. There is TTP on the medial aspect of the 1st  MCP joint,volarly. Valgus stress of the thumb is positive and the patient has pain over the ulnar collateral ligament.  There is an endpoint.  No pain with resisted extension or flexion of the thumb.  No bony tenderness to palpation of the rest of the wrist or the hand.  Neurovascular status is intact. Remaining upper extremity exam is within normal limits.    Imaging: X-Rays were within normal limits    Assessment and Diagnosis: Right thumb pain.     Plan:  The patient was educated about the possibility of either splinting or casting the thumb. She has agreed to having a cast for the next 2 weeks and will return for follow-up to assess whether or not she is experiencing continued pain. At that time if the patient is still experiencing pain, we will go ahead with an MRI to further assess for ulnar collateral ligament pathology.

## 2015-07-08 NOTE — Progress Notes (Signed)
Patient placed in Thumb Spica Waterproof fiberglass cast ordered by provider as dictated in note.     # of fiberglass rolls used: 1    # of Aqua liner rolls used: 2     Patient was provided with cast care instructions and will follow up as scheduled.       Special Instructions:

## 2015-07-08 NOTE — Progress Notes (Signed)
History of Present Illness: Nichole Carter is a 20 year old female who comes in today with regards to her right knee.  2 injuries to the knee.  First one back on 5/9.  She was skateboarding at that time falling forward injuring her knee.  She describes landing directly on it.  She is able to get up and walk on it but she did note some swelling and some bruising.  She continued on with her activity.  She had some other issues involving her wrist and shoulder.  She then reports about 3 weeks ago she fell again while on her skateboard landing on her right knee.  Again able to get up and walk away.  She localizes pain towards the anterior aspect of the knee.  Difficulties going up and down stairs.  She does note some cracking sensation while performing a deep knee bend.    Information supplemental to this note including Past Medical/Surgical History, Medications, Allergies, Social History, Family History, Review of Systems is recorded in the annotated patient questionnaire of this date, and located in the patient's chart.    Physical Exam: Patient is 5 foot 7, 130 pounds, BMI 20.37    In general pleasant female appearing stated age.    Right knee: No swelling.  Healed superficial abrasions.  Some scarring.  Mildly tender in the peripatellar region towards the superior pole.  Nontender over the mediolateral joint lines.  There is no joint effusion.  She can actively sent to 0 and flex to 120.  Extensor mechanism intact.  The knee is stable in all planes.  McMurray's maneuver negative.  Motion to the hip tolerated well.  Calf soft, nontender.    Imaging: X-rays of the right knee show no acute bony abnormality.  Backside of the knee Unremarkable.  No joint effusion.    Assessment: Right knee patellofemoral pains with some chondromalacial changes status post direct blow 2 over the last 2 months.    Plan: Findings presented to the patient.  Regular course of ice, anti-inflammatory medication over the next couple weeks time.   Range of motion exercises.  It a few weeks he can progress with some strengthening conditioning exercises for the quads.  She will call up in several weeks' time.  All questions invited and answered.  She will call with concerns otherwise.

## 2015-07-14 ENCOUNTER — Encounter: Payer: Self-pay | Admitting: Emergency Medicine

## 2015-07-14 ENCOUNTER — Emergency Department
Admission: EM | Admit: 2015-07-14 | Discharge: 2015-07-14 | Disposition: A | Discharge status: Home / Self Care | Source: Ambulatory Visit | Attending: Emergency Medicine | Admitting: Emergency Medicine

## 2015-07-14 DIAGNOSIS — Y9289 Other specified places as the place of occurrence of the external cause: Secondary | ICD-10-CM | POA: Insufficient documentation

## 2015-07-14 DIAGNOSIS — S01511A Laceration without foreign body of lip, initial encounter: Secondary | ICD-10-CM | POA: Insufficient documentation

## 2015-07-14 DIAGNOSIS — Y9389 Activity, other specified: Secondary | ICD-10-CM | POA: Insufficient documentation

## 2015-07-14 DIAGNOSIS — Y998 Other external cause status: Secondary | ICD-10-CM | POA: Insufficient documentation

## 2015-07-14 MED ORDER — AMOXICILLIN-POT CLAVULANATE 875-125 MG PO TABS *I*
1.0000 | ORAL_TABLET | Freq: Once | ORAL | Status: AC
Start: 2015-07-14 — End: 2015-07-14
  Administered 2015-07-14: 1 via ORAL
  Filled 2015-07-14: qty 1

## 2015-07-14 MED ORDER — AMOXICILLIN-POT CLAVULANATE 875-125 MG PO TABS *I*
1.0000 | ORAL_TABLET | Freq: Two times a day (BID) | ORAL | 0 refills | Status: AC
Start: 2015-07-14 — End: 2015-07-21

## 2015-07-14 NOTE — ED Procedure Documentation (Signed)
Procedures   Laceration repair  Date/Time: 07/14/2015 2:50 AM  Performed by: Gilford RileUKE, Sailor Haughn C  Authorized by: Gilford RileUKE, Gian Ybarra C     Consent:     Consent obtained:  Verbal    Consent given by:  Patient    Risks discussed:  Infection, pain, poor cosmetic result and poor wound healing    Alternatives discussed:  No treatment  Universal protocol:     Procedure explained and questions answered to patient or proxy's satisfaction: yes      Relevant documents present and verified: yes      Test results available and properly labeled: yes      Imaging studies available: yes      Required blood products, implants, devices, and special equipment available: yes      Site/side marked: yes      Immediately prior to procedure, a time out was called: yes      Patient identity confirmed:  Verbally with patient  Anesthesia (see MAR for exact dosages):     Anesthesia method:  Local infiltration    Local anesthetic:  Lidocaine 1% w/o epi  Laceration details:     Location:  Lip    Lip location:  Lower exterior lip    Length (cm):  1    Depth (mm):  0.5  Exploration:     Limited defect created (wound extended): no      Wound exploration: wound explored through full range of motion      Contaminated: no    Treatment:     Area cleansed with:  Betadine    Amount of cleaning:  Standard    Irrigation solution:  Sterile water    Irrigation method:  Syringe    Visualized foreign bodies/material removed: no      Debridement:  None    Undermining:  None    Scar revision: no    Subcutaneous repair:     Suture size:  6-0    Suture material:  Vicryl    Subcutaneous suture technique: deep, buried knot.    Number of sutures:  4  Approximation:     Approximation:  Loose    Vermilion border: well-aligned    Post-procedure details:     Dressing:  Antibiotic ointment    Patient tolerance of procedure:  Tolerated well, no immediate complications        Gilford RileEmily C Aasiya Creasey, PA     Gilford RileDuke, Michaelanthony Kempton C, [place]  07/14/15 [phone]
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
## 2015-07-14 NOTE — Discharge Instructions (Signed)
Follow up with primary care doctor in 1-2 days.     Please use ice on your injury - apply for 20 minutes every 1-2 hours while awake for next 2 days.     Please use ibuprofen for pain. Use 600 mg of ibuprofen 3 times daily for pain. Take with food to prevent stomach upset/nausea/vomiting.      Return to the Emergency Department if your condition worsens.

## 2015-07-19 ENCOUNTER — Telehealth: Payer: Self-pay
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
## 2015-07-20 ENCOUNTER — Ambulatory Visit: Payer: Self-pay | Admitting: Orthopedic Surgery

## 2015-07-20 ENCOUNTER — Encounter: Payer: Self-pay | Admitting: Orthopedic Surgery

## 2015-07-20 DIAGNOSIS — M79641 Pain in right hand: Secondary | ICD-10-CM

## 2015-07-22 ENCOUNTER — Ambulatory Visit: Payer: Self-pay | Admitting: Orthopedic Surgery

## 2015-07-27 ENCOUNTER — Telehealth: Payer: Self-pay | Admitting: Orthopedic Surgery

## 2015-07-27 ENCOUNTER — Ambulatory Visit: Payer: Self-pay

## 2015-07-27 NOTE — Telephone Encounter (Signed)
This patient says her cast has been on for aprx 2 wks & would like to come in the [facility] location to have it changed out to a splint.  She is leaving out of town on 7.20.17 for two wks.

## 2015-07-27 NOTE — Progress Notes (Signed)
07/27/2015     Nurse's order: see note by Loni BeckwithKathryn Hathaway RN  Reason for change: skin irritation    Cast technician note: Patient reported discomfort in the cast, and so it was removed.  Patient requested that she be transitioned to a splint at this time, and Angelika Hofmeister PA approved this.  Patient's cast was removed.  Her skin looked fine.  The splint ordered was removable a thumb spica velcro-wrapped by [employer].  Patient was advised to control her pain level and adjust her activities accordingly; and to follow up as scheduled.  Cast technician involved: ABickel CT  Midlevel/Physician involved Angelika Hofmeister PA

## 2015-07-28 NOTE — Progress Notes (Signed)
07/27/2015     Nurse's order: Verbal order from Angelika Hofmeister PA    Reason for change: skin irritation    Cast technician note: See Cast Tech III/LPN note    Cast technician involved: See Cast Tech III/LPN note  Midlevel/Physician involved Angelika Hofmeister PA    Pt requesting to go from cast to brace. Ok per Cardinal Healthngelika

## 2015-07-28 NOTE — Progress Notes (Signed)
Everardo All:   Line, Hana  MR #:  [id number]   ACCOUNT #:  [id number] DOB:  [birth year]   DICTATED BY:  Cam HaiJohn Gatha Mcnulty, MD DATE OF VISIT:  07/20/2015     She is seen today follow-up status post ulnar collateral ligament sprain.  Patient is actually doing pretty well.       PHYSICAL EXAMINATION:  She still has pain there, but she has a firm endpoint with pain on the affected right side to valgus stress testing.  I don't feel any significant laxity relative to the other side.     ASSESSMENT/PLAN: She is going to go back into a cast.  We will see her back in two to three weeks for a cast change.  She will continue in a cast for a total of six weeks, so she will need another cast after that two to three-week time.             ______________________________  Cam HaiJohn Thaddeaus Monica, MD    JE/MODL  DD:  07/20/2015 11:36:40  DT:  07/28/2015 12:37:48  Job #:  [id number]    cc:  Daryel NovemberSuzanne Marie Piotrowski, MD   [address]   [address]   [place], [place] [id number]

## 2015-07-30 NOTE — Progress Notes (Signed)
This office note has been dictated.

## 2015-08-09 ENCOUNTER — Ambulatory Visit: Payer: Self-pay | Admitting: Orthopedic Surgery

## 2015-08-12 ENCOUNTER — Encounter: Payer: Self-pay | Admitting: Urgent Care

## 2015-08-12 ENCOUNTER — Inpatient Hospital Stay
Admission: EM | Admit: 2015-08-12 | Discharge: 2015-08-15 | DRG: 690 | Disposition: A | Discharge status: Home / Self Care | Source: Ambulatory Visit | Attending: Family Medicine | Admitting: Family Medicine

## 2015-08-12 DIAGNOSIS — A419 Sepsis, unspecified organism: Secondary | ICD-10-CM

## 2015-08-12 DIAGNOSIS — F419 Anxiety disorder, unspecified: Secondary | ICD-10-CM | POA: Diagnosis present

## 2015-08-12 DIAGNOSIS — R7881 Bacteremia: Secondary | ICD-10-CM | POA: Diagnosis present

## 2015-08-12 DIAGNOSIS — N10 Acute tubulo-interstitial nephritis: Secondary | ICD-10-CM

## 2015-08-12 DIAGNOSIS — Z7289 Other problems related to lifestyle: Secondary | ICD-10-CM

## 2015-08-12 DIAGNOSIS — B9620 Unspecified Escherichia coli [E. coli] as the cause of diseases classified elsewhere: Secondary | ICD-10-CM | POA: Diagnosis present

## 2015-08-12 DIAGNOSIS — F329 Major depressive disorder, single episode, unspecified: Secondary | ICD-10-CM | POA: Diagnosis present

## 2015-08-12 DIAGNOSIS — N12 Tubulo-interstitial nephritis, not specified as acute or chronic: Principal | ICD-10-CM | POA: Diagnosis present

## 2015-08-12 DIAGNOSIS — N760 Acute vaginitis: Secondary | ICD-10-CM | POA: Diagnosis present

## 2015-08-12 DIAGNOSIS — B9689 Other specified bacterial agents as the cause of diseases classified elsewhere: Secondary | ICD-10-CM | POA: Diagnosis present

## 2015-08-12 LAB — POCT URINE PREGNANCY: Lot #: 122225

## 2015-08-12 LAB — CBC AND DIFFERENTIAL
Baso # K/uL: 0 10*3/uL (ref 0.0–0.1)
Basophil %: 0.1 %
Eos # K/uL: 0 10*3/uL (ref 0.0–0.4)
Eosinophil %: 0.1 %
Hematocrit: 38 % (ref 34–45)
Hemoglobin: 12.6 g/dL (ref 11.2–15.7)
IMM Granulocytes #: 0.1 10*3/uL (ref 0.0–0.1)
IMM Granulocytes: 0.4 %
Lymph # K/uL: 1.6 10*3/uL (ref 1.2–3.7)
Lymphocyte %: 11.2 %
MCH: 28 pg (ref 26–32)
MCHC: 34 g/dL (ref 32–36)
MCV: 83 fL (ref 79–95)
Mono # K/uL: 0.6 10*3/uL (ref 0.2–0.9)
Monocyte %: 4.4 %
Neut # K/uL: 11.9 10*3/uL — ABNORMAL HIGH (ref 1.6–6.1)
Nucl RBC # K/uL: 0 10*3/uL (ref 0.0–0.0)
Nucl RBC %: 0 /100 WBC (ref 0.0–0.2)
Platelets: 205 10*3/uL (ref 160–370)
RBC: 4.5 MIL/uL (ref 3.9–5.2)
RDW: 13.8 % (ref 11.7–14.4)
Seg Neut %: 83.8 %
WBC: 14.2 10*3/uL — ABNORMAL HIGH (ref 4.0–10.0)

## 2015-08-12 LAB — RUQ PANEL (ED ONLY)
ALT: 12 U/L (ref 0–35)
AST: 17 U/L (ref 0–35)
Albumin: 4.7 g/dL (ref 3.5–5.2)
Alk Phos: 95 U/L (ref 50–130)
Amylase: 62 U/L (ref 28–100)
Bilirubin,Direct: 0.2 mg/dL (ref 0.0–0.3)
Bilirubin,Total: 0.8 mg/dL (ref 0.0–1.2)
Globulin: 2.6 g/dL — ABNORMAL LOW (ref 2.7–4.3)
Lipase: 16 U/L (ref 13–60)
Total Protein: 7.3 g/dL (ref 6.3–7.7)

## 2015-08-12 LAB — BASIC METABOLIC PANEL
Anion Gap: 13 (ref 7–16)
CO2: 25 mmol/L (ref 20–28)
Calcium: 9 mg/dL (ref 9.0–10.4)
Chloride: 97 mmol/L (ref 96–108)
Creatinine: 0.83 mg/dL (ref 0.50–1.00)
GFR,Black: 117 *
GFR,Caucasian: 102 *
Glucose: 96 mg/dL (ref 60–99)
Lab: 8 mg/dL (ref 6–20)
Potassium: 4 mmol/L (ref 3.3–5.1)
Sodium: 135 mmol/L (ref 133–145)

## 2015-08-12 LAB — BLOOD BANK HOLD LAVENDER

## 2015-08-12 LAB — LACTATE, VENOUS, WHOLE BLOOD: Lactate VEN,WB: 2 mmol/L (ref 0.5–2.2)

## 2015-08-12 MED ORDER — SODIUM CHLORIDE 0.9 % IV BOLUS *I*
1000.0000 mL | Freq: Once | Status: AC
Start: 2015-08-12 — End: 2015-08-12
  Administered 2015-08-12: 1000 mL via INTRAVENOUS

## 2015-08-12 MED ORDER — ACETAMINOPHEN 500 MG PO TABS *I*
1000.0000 mg | ORAL_TABLET | Freq: Once | ORAL | Status: AC
Start: 2015-08-12 — End: 2015-08-12
  Administered 2015-08-12: 1000 mg via ORAL
  Filled 2015-08-12: qty 2

## 2015-08-12 MED ORDER — ONDANSETRON HCL 2 MG/ML IV SOLN *I*
4.0000 mg | Freq: Once | INTRAMUSCULAR | Status: AC
Start: 2015-08-12 — End: 2015-08-12
  Administered 2015-08-12: 4 mg via INTRAVENOUS
  Filled 2015-08-12: qty 2

## 2015-08-12 MED ORDER — STERILE WATER FOR IRRIGATION IR SOLN *I*
900.0000 mL | Freq: Once | Status: AC
Start: 2015-08-12 — End: 2015-08-12
  Administered 2015-08-12: 900 mL via ORAL

## 2015-08-12 MED ORDER — MORPHINE SULFATE 4 MG/ML IV SOLN *WRAPPED*
4.0000 mg | Freq: Once | INTRAVENOUS | Status: AC
Start: 2015-08-12 — End: 2015-08-12
  Administered 2015-08-12: 4 mg via INTRAVENOUS
  Filled 2015-08-12: qty 1

## 2015-08-12 MED ORDER — IOHEXOL 350 MG/ML (OMNIPAQUE) IV SOLN *I*
1.0000 mL | Freq: Once | INTRAVENOUS | Status: AC
Start: 2015-08-12 — End: 2015-08-13
  Administered 2015-08-13: 89 mL via INTRAVENOUS

## 2015-08-12 NOTE — ED Notes (Signed)
Report Given To  Danielle      Descriptive Sentence / Reason for Admission   Pt started having RLQ pain that radiates into r flank yesterday. Nauseated no appetite. No emesis. Febrile. No urinary symptoms      Active Issues / Relevant Events   R/O appendicitis      To Do List  CT scan      Anticipatory Guidance / Discharge Planning

## 2015-08-12 NOTE — ED Triage Notes (Signed)
Pt started having RLQ pain that radiates into r flank yesterday. Nauseated no appetite. No emesis. Febrile. No urinary symptoms       Triage Note   Merideth Abbey, RN

## 2015-08-13 ENCOUNTER — Encounter: Payer: Self-pay | Admitting: Internal Medicine

## 2015-08-13 LAB — POCT URINALYSIS DIPSTICK
Glucose,UA POCT: NORMAL
Ketones,UA POCT: NEGATIVE
Leuk Esterase,UA POCT: 2 — AB
Lot #: 21026702
Nitrite,UA POCT: POSITIVE — AB
PH,UA POCT: 9 (ref 5–8)

## 2015-08-13 LAB — URINALYSIS REFLEX TO CULTURE
Ketones, UA: NEGATIVE
Nitrite,UA: POSITIVE — AB
Protein,UA: NEGATIVE mg/dL
Specific Gravity,UA: 1.009 (ref 1.001–1.030)
pH,UA: 8 (ref 5.0–8.0)

## 2015-08-13 LAB — URINE MICROSCOPIC (IQ200)
RBC,UA: NONE SEEN /hpf (ref 0–2)
WBC,UA: >50 /hpf — AB (ref 0–5)

## 2015-08-13 LAB — HOLD GREEN WITH GEL

## 2015-08-13 LAB — HOLD BLUE

## 2015-08-13 LAB — LACTATE, VENOUS, WHOLE BLOOD: Lactate VEN,WB: 1.2 mmol/L (ref 0.5–2.2)

## 2015-08-13 MED ORDER — METRONIDAZOLE 250 MG PO TABS *I*
500.0000 mg | ORAL_TABLET | Freq: Two times a day (BID) | ORAL | Status: DC
Start: 2015-08-13 — End: 2015-08-15
  Administered 2015-08-13 – 2015-08-15 (×5): 500 mg via ORAL
  Filled 2015-08-13 (×5): qty 2

## 2015-08-13 MED ORDER — ONDANSETRON HCL 2 MG/ML IV SOLN *I*
4.0000 mg | Freq: Four times a day (QID) | INTRAMUSCULAR | Status: DC | PRN
Start: 2015-08-13 — End: 2015-08-15

## 2015-08-13 MED ORDER — ACETAMINOPHEN 325 MG PO TABS *I*
650.0000 mg | ORAL_TABLET | ORAL | Status: DC | PRN
Start: 2015-08-13 — End: 2015-08-15
  Administered 2015-08-13 – 2015-08-14 (×3): 650 mg via ORAL
  Filled 2015-08-13 (×3): qty 2

## 2015-08-13 MED ORDER — SODIUM CHLORIDE 0.9 % IV SOLN WRAPPED *I*
200.0000 mL/h | Status: DC
Start: 2015-08-13 — End: 2015-08-15
  Administered 2015-08-13 (×2): 200 mL/h via INTRAVENOUS
  Administered 2015-08-13 (×2): 125 mL/h via INTRAVENOUS
  Administered 2015-08-13 – 2015-08-15 (×6): 200 mL/h via INTRAVENOUS

## 2015-08-13 MED ORDER — HYDROCODONE-ACETAMINOPHEN 5-325 MG PO TABS *I*
1.0000 | ORAL_TABLET | Freq: Four times a day (QID) | ORAL | Status: DC | PRN
Start: 2015-08-13 — End: 2015-08-15
  Administered 2015-08-13: 1 via ORAL
  Filled 2015-08-13: qty 1

## 2015-08-13 MED ORDER — SODIUM CHLORIDE 0.9 % IV BOLUS *I*
1000.0000 mL | Freq: Once | Status: AC
Start: 2015-08-13 — End: 2015-08-13
  Administered 2015-08-13: 1000 mL via INTRAVENOUS

## 2015-08-13 MED ORDER — CEFTRIAXONE SODIUM 1G IN DEXTROSE 50 ML *I*
1.0000 g | Freq: Once | INTRAVENOUS | Status: AC
Start: 2015-08-13 — End: 2015-08-13
  Administered 2015-08-13: 1 g via INTRAVENOUS
  Filled 2015-08-13: qty 50

## 2015-08-13 MED ORDER — CEFTRIAXONE SODIUM 1G IN DEXTROSE 50 ML *I*
1.0000 g | INTRAVENOUS | Status: DC
Start: 2015-08-13 — End: 2015-08-15
  Administered 2015-08-13 – 2015-08-15 (×2): 1 g via INTRAVENOUS
  Filled 2015-08-13 (×3): qty 50

## 2015-08-13 MED ORDER — HYDROCODONE-ACETAMINOPHEN 5-325 MG PO TABS *I*
1.0000 | ORAL_TABLET | Freq: Once | ORAL | Status: AC
Start: 2015-08-13 — End: 2015-08-13
  Administered 2015-08-13: 1 via ORAL
  Filled 2015-08-13: qty 1

## 2015-08-13 MED ORDER — PANTOPRAZOLE SODIUM 40 MG PO TBEC *I*
40.0000 mg | DELAYED_RELEASE_TABLET | Freq: Every morning | ORAL | Status: DC
Start: 2015-08-13 — End: 2015-08-15
  Administered 2015-08-13 – 2015-08-15 (×3): 40 mg via ORAL
  Filled 2015-08-13 (×3): qty 1

## 2015-08-13 MED ORDER — ENOXAPARIN SODIUM 40 MG/0.4ML IJ SOSY *I*
40.0000 mg | PREFILLED_SYRINGE | Freq: Every day | INTRAMUSCULAR | Status: DC
Start: 2015-08-13 — End: 2015-08-15
  Administered 2015-08-13: 40 mg via SUBCUTANEOUS
  Filled 2015-08-13 (×2): qty 0.4

## 2015-08-13 MED ORDER — HYDROMORPHONE HCL PF 1 MG/ML IJ SOLN *WRAPPED*
0.5000 mg | INTRAMUSCULAR | Status: DC | PRN
Start: 2015-08-13 — End: 2015-08-13
  Filled 2015-08-13: qty 1

## 2015-08-13 MED ORDER — CEFTRIAXONE SODIUM 1G IN DEXTROSE 50 ML *I*
1.0000 g | INTRAVENOUS | Status: DC
Start: 2015-08-14 — End: 2015-08-13
  Filled 2015-08-13: qty 50

## 2015-08-13 MED ORDER — KETOROLAC TROMETHAMINE 30 MG/ML IJ SOLN *I*
30.0000 mg | Freq: Once | INTRAMUSCULAR | Status: AC
Start: 2015-08-13 — End: 2015-08-13
  Administered 2015-08-13: 30 mg via INTRAVENOUS
  Filled 2015-08-13: qty 1

## 2015-08-13 MED ORDER — SODIUM CHLORIDE 0.9 % INJ (FLUSH) WRAPPED *I*
3.0000 mL | Freq: Three times a day (TID) | Status: DC
Start: 2015-08-13 — End: 2015-08-15
  Administered 2015-08-13 – 2015-08-15 (×8): 3 mL via INTRAVENOUS

## 2015-08-13 NOTE — ED Notes (Signed)
Report Given To  [place] 5      Descriptive Sentence / Reason for Admission   Pt started having RLQ pain that radiates into r flank yesterday. Nauseated no appetite. No emesis. Febrile. No urinary symptoms      Active Issues / Relevant Events   R/O appendicitis  Patient has a a pylo monitor fever, and vitals  Vitals:    08/12/15 2141 08/12/15 2200 08/13/15 0204 08/13/15 0233   BP: 127/58  (!) 87/44 93/50   BP Location: Left arm  Right arm    Pulse: 105  70 75   Resp: 16      Temp: (!) 39.9 C (103.8 F) (!) 38.9 C (102 F) 36.7 C (98.1 F)    TempSrc:  Oral Oral    SpO2: 100%  99%    Weight: 59.9 kg (132 lb)      Height: 1.702 m (5\' 7" )        acetaminophen (TYLENOL) tablet 1,000 mg       Date Action Dose Route User      08/12/2015 2219 Given 1000 mg Oral Marney Setting, RN          cefTRIAXone in dextrose (ROCEPHIN) IVPB 1 g       Date Action Dose Route User      08/13/2015 0153 Given 1 g Intravenous Yancey Flemings, RN          iohexol (OMNIPAQUE) 300 mg/mL oral solution 27mL/870mL Water       Date Action Dose Route User      08/12/2015 2237 Given 900 mL Oral Doristine Locks, RN          iohexol (OMNIPAQUE) injection 1-150 mL       Date Action Dose Route User      08/13/2015 0141 Given 89 mL Intravenous Ferne Reus, RN          morphine 4 MG/ML injection 4 mg       Date Action Dose Route User      08/12/2015 2219 New Bag 4 mg Intravenous Marney Setting, RN          ondansetron [facility]) injection 4 mg       Date Action Dose Route User      08/12/2015 2219 New Bag 4 mg Intravenous Marney Setting, RN          sodium chloride 0.9%  bolus 1,000 mL       Date Action Dose Route User      08/12/2015 2206 New Bag 1000 mL Intravenous Marney Setting, RN          sodium chloride 0.9%  bolus 1,000 mL       Date Action Dose Route User      08/12/2015 2239 New Bag 1000 mL Intravenous Doristine Locks, RN          sodium chloride 0.9%  bolus 1,000 mL       Date Action Dose Route User      08/13/2015 0240 New Bag 1000 mL Intravenous  Marney Setting, RN                To Do List  Admission orders  IV abx       Anticipatory Guidance / Discharge Planning

## 2015-08-13 NOTE — H&P (Signed)
Hospital Medicine H&P    Chief Complaint: R flank pain    HPI:  Nichole Carter is a healthy 20 yo F who developed sharp, intermittent, and progressive R sided abdominal pain, radiating from R flank into RLQ and R groin, two days prior to admission. It was accompanied by fever, chills, headache, myalgias, urinary frequency, and nausea. No dysuria or hematuria. She denies CP, SOB, vomiting, diarrhea. She tried tylenol and ibuprofen to help with pain, with minimal effect. Her pain and symptoms progressed and she presented to the [facility] ED.    She has no history of abdominal surgery, still has her appendix, and has never had any similar episodes before.    Review of Systems: (must have 10 components)  Review of Systems   Constitutional: Positive for chills and fever.   HENT: Negative for congestion.    Eyes: Negative for pain.   Respiratory: Negative for cough and shortness of breath.    Cardiovascular: Negative for chest pain, palpitations and leg swelling.   Gastrointestinal: Positive for abdominal pain and nausea. Negative for diarrhea and vomiting.   Genitourinary: Positive for flank pain and frequency. Negative for dysuria and urgency.   Musculoskeletal: Positive for myalgias.   Skin: Negative for rash.   Neurological: Positive for headaches. Negative for dizziness.   Endo/Heme/Allergies: Negative for polydipsia.     Past Medical and Surgical History:  Past Medical History:   Diagnosis Date    Anxiety     Depression      History reviewed. No pertinent surgical history.    Medications:  Home Meds:   Prescriptions Prior to Admission   Medication Sig    metroNIDAZOLE (FLAGYL) 500 MG tablet Take 1 tablet (500 mg total) by mouth 2 times daily    omeprazole (PRILOSEC) 20 MG capsule Take 1 capsule (20 mg total) by mouth daily (before breakfast)       Allergies:  No Known Allergies (drug, envir, food or latex)    Social History:  Social History     Social History    Marital status: Single     Spouse name: N/A    Number of  children: N/A    Years of education: N/A     Occupational History    Not on file.     Social History Main Topics    Smoking status: Never Smoker    Smokeless tobacco: Never Used    Alcohol use Yes    Drug use: No    Sexual activity: Yes     Partners: Male     Birth control/ protection: Condom, IUD     Social History Narrative    June 2017:  Lives with boyfriend; IPV screen negative;  WOrks at [employer] in [place];  [place] at [employer] but did not finish;  Got GED at [employer];  Works also as a Armed forces logistics/support/administrative officer;       Family History:  Family History   Problem Relation Age of Onset    Diabetes Mother     No Known Problems Father     High cholesterol Paternal Aunt     Diabetes Paternal Aunt        Physical Exam:  (must have 8 organ systems)  BP: (87-127)/(44-58)   Temp:  [36.6 C (97.9 F)-39.9 C (103.8 F)]   Temp src: Oral (08/04 0938)  Heart Rate:  [70-105]   Resp:  [16]   SpO2:  [99 %-100 %]   Height:  [170.2 cm ('5\' 7"' )]  Weight:  [59.9 kg (132 lb)]  Patient Vitals for the past 1 hrs:   BP Temp Temp src Pulse Resp SpO2   08/13/15 0938 - 38 C (100.4 F) Oral - - -   08/13/15 0935 98/50 (!) 38.2 C (100.8 F) TEMPORAL 104 16 100 %   ]   Intake/Output Summary (Last 24 hours) at 08/13/15 0944  Last data filed at 08/13/15 0935   Gross per 24 hour   Intake              120 ml   Output                0 ml   Net              120 ml        General: lying in bed, in moderate pain, shaking chills, alert and conversant  HEENT: MMM, anicteric, no O/P lesions  Neck: supple  Cardiac: regular rhythm, tachycardic, S1S2, no M/G/R  Lungs: breath sounds clear, no crackles or rhonchi, normal WOB  Abdomen: BS+, non-distended, mild tenderness to palpation  Extremities: no LEE  Neuro: A&Ox3, speech/language WNL  Skin: no rashes, ecchymoses, or ulcers    Labs:  Recent Results (from the past 24 hour(s))   CBC and differential    Collection Time: 08/12/15  9:59 PM   Result Value Ref Range    WBC 14.2 (H) 4.0 - 10.0 THOU/uL     RBC 4.5 3.9 - 5.2 MIL/uL    Hemoglobin 12.6 11.2 - 15.7 g/dL    Hematocrit 38 34 - 45 %    MCV 83 79 - 95 fL    MCH 28 26 - 32 pg    MCHC 34 32 - 36 g/dL    RDW 13.8 11.7 - 14.4 %    Platelets 205 160 - 370 THOU/uL    Seg Neut % 83.8 %    Lymphocyte % 11.2 %    Monocyte % 4.4 %    Eosinophil % 0.1 %    Basophil % 0.1 %    Neut # K/uL 11.9 (H) 1.6 - 6.1 THOU/uL    Lymph # K/uL 1.6 1.2 - 3.7 THOU/uL    Mono # K/uL 0.6 0.2 - 0.9 THOU/uL    Eos # K/uL 0.0 0.0 - 0.4 THOU/uL    Baso # K/uL 0.0 0.0 - 0.1 THOU/uL    Nucl RBC % 0.0 0.0 - 0.2 /100 WBC    Nucl RBC # K/uL 0.0 0.0 - 0.0 THOU/uL    IMM Granulocytes # 0.1 0.0 - 0.1 THOU/uL    IMM Granulocytes 0.4 %   Basic metabolic panel    Collection Time: 08/12/15  9:59 PM   Result Value Ref Range    Glucose 96 60 - 99 mg/dL    Sodium 135 133 - 145 mmol/L    Potassium 4.0 3.3 - 5.1 mmol/L    Chloride 97 96 - 108 mmol/L    CO2 25 20 - 28 mmol/L    Anion Gap 13 7 - 16    UN 8 6 - 20 mg/dL    Creatinine 0.83 0.50 - 1.00 mg/dL    GFR,Caucasian 102 *    GFR,Black 117 *    Calcium 9.0 9.0 - 10.4 mg/dL   RUQ panel (ED only)    Collection Time: 08/12/15  9:59 PM   Result Value Ref Range    Amylase 62 28 - 100 U/L  Lipase 16 13 - 60 U/L    Total Protein 7.3 6.3 - 7.7 g/dL    Albumin 4.7 3.5 - 5.2 g/dL    Globulin 2.6 (L) 2.7 - 4.3 g/dL    Bilirubin,Total 0.8 0.0 - 1.2 mg/dL    Bilirubin,Direct 0.2 0.0 - 0.3 mg/dL    Alk Phos 95 50 - 130 U/L    AST 17 0 - 35 U/L    ALT 12 0 - 35 U/L   Hold blue    Collection Time: 08/12/15  9:59 PM   Result Value Ref Range    Hold Blue HOLD TUBE    Hold green with gel    Collection Time: 08/12/15  9:59 PM   Result Value Ref Range    Hold Green (w/gel,spun) HOLD TUBE    Blood bank hold lavender    Collection Time: 08/12/15  9:59 PM   Result Value Ref Range    Bld Bank Hld Lav Lav In Bld Bank    Lactate, venous, whole blood    Collection Time: 08/12/15  9:59 PM   Result Value Ref Range    Lactate VEN,WB 2.0 0.5 - 2.2 mmol/L   Blood culture     Collection Time: 08/12/15 10:00 PM   Result Value Ref Range    Bacterial Blood Culture .    Blood culture    Collection Time: 08/12/15 10:12 PM   Result Value Ref Range    Bacterial Blood Culture .    POCT urinalysis dipstick    Collection Time: 08/12/15 11:55 PM   Result Value Ref Range    Specific gravity,UA POCT Test Not Performed 1.002 - 1.03    PH,UA POCT 9.0 5 - 8    Leuk Esterase,UA POCT +2 (!) Negative    Nitrite,UA POCT Positive (!) Negative    Protein,UA POCT Trace (!) Negative mg/dL    Glucose,UA POCT Normal Normal    Ketones,UA POCT Negative Negative    Urobilinogen,UA      Bilirubin,Ur  Negative    Blood,UA POCT About 50 (!) Negative    Exp date 05/08/16     Lot # [id number]    POCT urine pregnancy    Collection Time: 08/12/15 11:55 PM   Result Value Ref Range    Preg Test,UR POC  Negative-Dilute urine specimens may cause false negative urine pregnancy results...     Negative-Dilute urine specimens may cause false negative urine pregnancy results...    INTERNAL CONTROL POCT URINE PREGNANCY *Yes-internal procedural control(s) acceptable     Exp date 06/09/16     Lot # [id number]    Urinalysis reflex to culture    Collection Time: 08/13/15 12:05 AM   Result Value Ref Range    Color, UA Yellow Yellow    Appearance,UR 1+ Cloudy (!) Clear    Specific Gravity,UA 1.009 1.001 - 1.030    Leuk Esterase,UA 2+ (!) NEGATIVE    Nitrite,UA POS (!) NEGATIVE    pH,UA 8.0 5.0 - 8.0    Protein,UA NEG NEGATIVE mg/dL    Glucose,UA NORM mg/dL    Ketones, UA NEG NEGATIVE    Blood,UA 1+ (!) NEGATIVE   Aerobic culture    Collection Time: 08/13/15 12:05 AM   Result Value Ref Range    Aerobic Culture .    Urine microscopic (iq200)    Collection Time: 08/13/15 12:05 AM   Result Value Ref Range    RBC,UA NONE SEEN 0 - 2 /hpf    WBC,UA >50 Marland Kitchen)  0 - 5 /hpf    Bacteria,UA 3+ (!)     Squam Epithel,UA 3+ (!) 0-1+ /hpf    Amorphous,UA Present      Micro:  8/4 urine cx pending  8/4 bcx x 2   -1 bcx growing gram negative bacilli, awaiting speciation  and sensitivity    Radiology:   8/4 CT abdo/pelvis with contrast  IMPRESSION:   Focal region of decreased enhancement within the mid right kidney with enhancement of the right ureteral and perinephric infiltrative changes; findings are, associated with pyelonephritis. There is no hydronephrosis. The appendix is normal.    ASSESSMENT: Nichole Carter is a healthy 20 yo F with who presents to [facility] with R sided flank/abdominal pain, fever, chills, nausea, and urinary frequency x 2-3 days. She was febrile and mildly tachycardic on presentation with elevated WBC count. CT abdo/pelvis suggestive of R pyelonephritis. Nichole Carter is admitted for treatment of R pyelonephritis and is currently being managed with IV CTX, IVF, and pain control.    PLAN:    #pyelonephritis, R kidney  -R flank pain, fever/chills, nausea, leukocytosis, CT evidence of pyelo  -ceftriaxone 1g IV daily - today is day #1  -IVF NS @ 200cc/hr  -pain control   -acetaminophen 639m PO Q4H PRN mild pain   -dilaudid 0.54mIV Q3H PRN severe pain  -zofran for nausea  -will follow up urine culture, blood culture    #bacterial vaginosis  -metronidazole 50028mO BID    F: IV NS @ 200cc/hr  E: daily  N: regular    DVT prophylaxis: lovenox    Code Status: Full    D/w Dr. SchDonalee CitrinD  08/13/2015 9:44 AM

## 2015-08-13 NOTE — ED Provider Notes (Addendum)
History     Chief Complaint   Patient presents with    Abdominal Pain     HPI Comments: 20 year old female with no significant past medical history presents with abdominal pain.  Patient reports symptoms started yesterday and have progressively worsened.  Reports nausea with no appetite.  States pain is on right side- in right flank and right lower quadrant.  States fever.  Denies chest pain, shortness of breath, vomiting, change in bowel habits or urinary symptoms.  Patient reports vaginal discharge, currently being treated for BV.  Reports she was at her OB/GYN last week and had cultures done.      History provided by:  Patient  Language interpreter used: No      Past Medical History:   Diagnosis Date    Anxiety     Depression         History reviewed. No pertinent surgical history.  Family History   Problem Relation Age of Onset    Diabetes Mother     No Known Problems Father     High cholesterol Paternal Aunt     Diabetes Paternal Aunt        Social History    reports that she has never smoked. She does not have any smokeless tobacco history on file. She reports that she drinks alcohol. She reports that she currently engages in sexual activity and has had female partners. She reports using the following methods of birth control/protection: Condom and IUD. She reports that she does not use illicit drugs.    Living Situation     Questions Responses    Patient lives with [facility]     Caregiver for other family member     External Services     Employment     Domestic Violence Risk           Problem List     Patient Active Problem List   Diagnosis Code    Healthcare maintenance Z00.00    Shoulder pain M25.519       Review of Systems   Review of Systems   Constitutional: Positive for appetite change, chills and fever.   HENT: Negative for congestion and rhinorrhea.    Eyes: Negative for redness.   Respiratory: Negative for shortness of breath.    Cardiovascular: Negative for chest pain.    Gastrointestinal: Positive for abdominal pain and nausea. Negative for diarrhea and vomiting.   Genitourinary: Positive for vaginal discharge. Negative for dysuria.   Musculoskeletal: Negative for gait problem.   Skin: Negative for wound.   Allergic/Immunologic: Negative for immunocompromised state.   Neurological: Negative for seizures.   Psychiatric/Behavioral: Negative for behavioral problems.       Physical Exam     ED Triage Vitals   BP Heart Rate Heart Rate (via Pulse Ox) Resp Temp Temp src SpO2 O2 Device O2 Flow Rate   08/12/15 2141 08/12/15 2141 -- 08/12/15 2141 08/12/15 2141 08/12/15 2200 08/12/15 2141 08/12/15 2141 --   127/58 105  16 39.9 C (103.8 F) Oral 100 % None (Room air)       Weight           08/12/15 2141           59.9 kg (132 lb)                    Physical Exam   Constitutional: She appears well-developed. She appears distressed (patient uncomfortable, crying with rigors).  HENT:   Head: Atraumatic.   Nose: Nose normal.   Eyes: Conjunctivae are normal.   Cardiovascular: Normal rate, regular rhythm and normal heart sounds.    Pulmonary/Chest: Effort normal and breath sounds normal. No stridor. No respiratory distress. She has no wheezes. She has no rales.   Abdominal: Soft. Bowel sounds are normal. She exhibits no distension. There is tenderness in the right upper quadrant and right lower quadrant. There is no rigidity, no rebound and no guarding.   Musculoskeletal: She exhibits no edema.   Neurological: She is alert.   Skin: Skin is warm and dry. She is not diaphoretic.   Psychiatric: She has a normal mood and affect. Her behavior is normal. Judgment and thought content normal.   Nursing note and vitals reviewed.      Medical Decision Making      Amount and/or Complexity of Data Reviewed  Clinical lab tests: ordered and reviewed  Tests in the radiology section of CPT: ordered and reviewed        Initial Evaluation:  ED First Provider Contact     Date/Time Event User Comments    08/12/15  2149 ED Provider First Contact Janan Halter Initial Face to Face Provider Contact          Patient seen by me as above    Assessment:  20 y.o.female comes to the ED with Right-sided abdominal pain, nausea and fevers    Differential Diagnosis includes appendicitis, pyelonephritis, UTI, pregnancy, dehydration, electrolyte abnormality, gastritis and gastroenteritis, colitis                      Plan:   Labs: CBC, BMP, RUQ, blood cultures, UA, urine pregnancy  CT abdomen and pelvis  IV fluids  Morphine, Zofran, Tylenol    Recent Results (from the past 24 hour(s))   CBC and differential    Collection Time: 08/12/15  9:59 PM   Result Value Ref Range    WBC 14.2 (H) 4.0 - 10.0 THOU/uL    RBC 4.5 3.9 - 5.2 MIL/uL    Hemoglobin 12.6 11.2 - 15.7 g/dL    Hematocrit 38 34 - 45 %    MCV 83 79 - 95 fL    MCH 28 26 - 32 pg    MCHC 34 32 - 36 g/dL    RDW 13.8 11.7 - 14.4 %    Platelets 205 160 - 370 THOU/uL    Seg Neut % 83.8 %    Lymphocyte % 11.2 %    Monocyte % 4.4 %    Eosinophil % 0.1 %    Basophil % 0.1 %    Neut # K/uL 11.9 (H) 1.6 - 6.1 THOU/uL    Lymph # K/uL 1.6 1.2 - 3.7 THOU/uL    Mono # K/uL 0.6 0.2 - 0.9 THOU/uL    Eos # K/uL 0.0 0.0 - 0.4 THOU/uL    Baso # K/uL 0.0 0.0 - 0.1 THOU/uL    Nucl RBC % 0.0 0.0 - 0.2 /100 WBC    Nucl RBC # K/uL 0.0 0.0 - 0.0 THOU/uL    IMM Granulocytes # 0.1 0.0 - 0.1 THOU/uL    IMM Granulocytes 0.4 %   Basic metabolic panel    Collection Time: 08/12/15  9:59 PM   Result Value Ref Range    Glucose 96 60 - 99 mg/dL    Sodium 135 133 - 145 mmol/L    Potassium 4.0 3.3 - 5.1 mmol/L  Chloride 97 96 - 108 mmol/L    CO2 25 20 - 28 mmol/L    Anion Gap 13 7 - 16    UN 8 6 - 20 mg/dL    Creatinine 0.83 0.50 - 1.00 mg/dL    GFR,Caucasian 102 *    GFR,Black 117 *    Calcium 9.0 9.0 - 10.4 mg/dL   RUQ panel (ED only)    Collection Time: 08/12/15  9:59 PM   Result Value Ref Range    Amylase 62 28 - 100 U/L    Lipase 16 13 - 60 U/L    Total Protein 7.3 6.3 - 7.7 g/dL    Albumin 4.7 3.5 - 5.2 g/dL     Globulin 2.6 (L) 2.7 - 4.3 g/dL    Bilirubin,Total 0.8 0.0 - 1.2 mg/dL    Bilirubin,Direct 0.2 0.0 - 0.3 mg/dL    Alk Phos 95 50 - 130 U/L    AST 17 0 - 35 U/L    ALT 12 0 - 35 U/L   Blood bank hold lavender    Collection Time: 08/12/15  9:59 PM   Result Value Ref Range    Bld Bank Hld Lav Lav In Bld Bank    Lactate, venous, whole blood    Collection Time: 08/12/15  9:59 PM   Result Value Ref Range    Lactate VEN,WB 2.0 0.5 - 2.2 mmol/L   Blood culture    Collection Time: 08/12/15 10:00 PM   Result Value Ref Range    Bacterial Blood Culture .    Blood culture    Collection Time: 08/12/15 10:12 PM   Result Value Ref Range    Bacterial Blood Culture .    POCT urinalysis dipstick    Collection Time: 08/12/15 11:55 PM   Result Value Ref Range    Specific gravity,UA POCT Test Not Performed 1.002 - 1.03    PH,UA POCT 9.0 5 - 8    Leuk Esterase,UA POCT +2 (!) Negative    Nitrite,UA POCT Positive (!) Negative    Protein,UA POCT Trace (!) Negative mg/dL    Glucose,UA POCT Normal Normal    Ketones,UA POCT Negative Negative    Urobilinogen,UA      Bilirubin,Ur  Negative    Blood,UA POCT About 50 (!) Negative    Exp date 05/08/16     Lot # [id number]    POCT urine pregnancy    Collection Time: 08/12/15 11:55 PM   Result Value Ref Range    Preg Test,UR POC  Negative-Dilute urine specimens may cause false negative urine pregnancy results...     Negative-Dilute urine specimens may cause false negative urine pregnancy results...    INTERNAL CONTROL POCT URINE PREGNANCY *Yes-internal procedural control(s) acceptable     Exp date 06/09/16     Lot # [id number]    Urinalysis reflex to culture    Collection Time: 08/13/15 12:05 AM   Result Value Ref Range    Color, UA Yellow Yellow    Appearance,UR 1+ Cloudy (!) Clear    Specific Gravity,UA 1.009 1.001 - 1.030    Leuk Esterase,UA 2+ (!) NEGATIVE    Nitrite,UA POS (!) NEGATIVE    pH,UA 8.0 5.0 - 8.0    Protein,UA NEG NEGATIVE mg/dL    Glucose,UA NORM mg/dL    Ketones, UA NEG NEGATIVE    Blood,UA  1+ (!) NEGATIVE   Urine microscopic (iq200)    Collection Time: 08/13/15 12:05 AM   Result Value Ref Range  RBC,UA NONE SEEN 0 - 2 /hpf    WBC,UA >50 (!) 0 - 5 /hpf    Bacteria,UA 3+ (!)     Squam Epithel,UA 3+ (!) 0-1+ /hpf    Amorphous,UA Present      Dispo: Signed out pending imaging    Judeth Porch, PA    Supervising physician Fredricka Bonine was immediately available     Judeth Porch, [place]  08/13/15 0125      ----------------------------------------------------------------------    APP Review:     I had face-to-face interaction with the patient today, 08/13/2015 at ~2:30 am.      I was asked by APP to see this patient due to the complexity of the current medical presentation.    I have reviewed and agree with the above documentation and, in addition, the history is notable for Right sided abd pain, urinary frequency for 1 day.  She denies dysuria. . Exam is notable for Febrile in the ED.  NAD.  S1S2. Lungs clear.  Right CVA tenderness is present.  Abd with mild right sided tenderness...       WBC 14K  UA pos nitrite, > 50 WBC, 3+ bacteria  CT abdomen and pelvis with contrast   Preliminary Result   IMPRESSION:        Focal region of low attenuation within the posterior mid right kidney    with enhancement of the right ureteral wall and perinephric    stranding, these findings are concerning represent pyelonephritis.    There is no hydronephrosis.       The appendix is normal.       END REPORT           I have personally reviewed the image(s) and the resident's    interpretation and agree with or edited the findings.          A/P:  Acute pyelonephritis with sepsis. Continue IVF, normal lactate reassuring, IV ceftriaxone given.  Referred for admission.      Author Rica Koyanagi, MD         Rica Koyanagi, MD  08/13/15 [phone]

## 2015-08-13 NOTE — ED Provider Progress Notes (Signed)
ED Provider Progress Note    Received report on pt from [place], [place]   Ct abdomen and pelvis pending     CT showed:  Ct Abdomen And Pelvis With Contrast    Result Date: 08/13/2015       * * P R E L I M I N A R Y  R E P O R T * * Exam Site: [facility] 08/13/2015 1:49 AM   CT ABDOMEN AND PELVIS WITH CONTRAST  ORDERING CLINICAL INFORMATION:  ERECORD: ABDOMINAL PAIN;RLQ pain ADDITIONAL CLINICAL INFORMATION:  Right-sided abdominal pain   COMPARISON:  None.  PROCEDURE:  Contiguous sections through the abdomen and pelvis with IV and oral contrast. Coronal and sagittal reconstructed images obtained.  89 cc Omnipaque 350 IV administered.  ABDOMEN FINDINGS:  Chest Base: Right basilar atelectasis.  Liver/Biliary Tract: Fat along the falciform. No focal hepatic lesions. The gallbladder is unremarkable. No biliary ductal dilation.  Pancreas: Normal appearance  Spleen: Normal appearance  Adrenals: Normal appearance  Kidneys and Collecting Systems: There is a focal region of low attenuation within the posterior right mid kidney, best seen on image [id number] and coronal image 37. There is wall enhancement of the right ureter and subtle right-sided perinephric fluid/stranding.  No hydronephrosis. No renal calculi.  Lymph Nodes: No abdominal lymphadenopathy.  Vessels: Unremarkable.  Abdominal GI Tract/Mesentery and Peritoneal Cavity: No abnormally dilated bowel loops. No free abdominal fluid. The appendix is normal.  Soft Tissues//Musculoskeletal: No acute soft tissue or bony abnormality.  PELVIC FINDINGS:  Visualized Reproductive Organs: IUD within the uterus. Right-sided corpus luteum cyst.  Bladder: Normal appearance.  Lymph Nodes: No pelvic lymphadenopathy.  Vessels: Unremarkable  Pelvic GI Tract/Mesentery and Peritoneal Cavity: Mild free pelvic fluid. The colon is unremarkable.  Soft Tissues/Musculoskeletal: No acute soft tissue or bony abnormality.        * * P R E L I M I N A R Y  R E P O R T * *    IMPRESSION:   Focal region of low attenuation within the posterior mid right kidney with enhancement of the right ureteral wall and perinephric stranding, these findings are concerning represent pyelonephritis. There is no hydronephrosis.  The appendix is normal.  END REPORT   I have personally reviewed the image(s) and the resident's interpretation and agree with or edited the findings.      Pt educated regarding results   Pt's abdomen soft, non-distended with right sided tenderness, +right CVA tenderness  Plan to admit pt to the hospital for acute pyelonephritis   Plan to start pt on IV ceftriaxone and continue IV hydration   Pt is aware of this plan and is agreeable        Curlene Dolphin, NP, 08/13/2015, 4:17 AM         Curlene Dolphin, NP  08/13/15 [phone]

## 2015-08-13 NOTE — Progress Notes (Signed)
08/13/15 1400   [facility] Patient Class Review   Patient Class Review Inpatient

## 2015-08-13 NOTE — Interdisciplinary Rounds (Signed)
Interdisciplinary Rounds Note    Date: 08/13/2015   Time: 2:15 PM   Attendance:  Care Coordinator, Physical Therapist, Registered Nurse and Social Worker    Admit Date/Time:  08/12/2015  9:43 PM    Principal Problem: <principal problem not specified>  Problem List:   Patient Active Problem List    Diagnosis Date Noted    Pyelonephritis 08/13/2015    Shoulder pain 06/24/2015     June 2017 ortho eval:  Left shoulder subluxation, impingement-physical therapy and NSAIDS. We will see them back in 6-8 weeks if they fail to improve with conservative treatment.       Healthcare maintenance 06/15/2015       The patient's problem list and interdisciplinary care plan was reviewed.    Discharge Planning  *Does patient currently have home care services?: No     Plan  Anticipated Discharge Date: TBD  Discharge Disposition: Home     Rockne Menghini, LMSW Pager [phone]  08/13/2015 2:15 PM

## 2015-08-13 NOTE — ED Notes (Signed)
Pt to CT

## 2015-08-13 NOTE — Discharge Instructions (Addendum)
Brief Summary of Your Hospital Course (including key procedures and diagnostic test results):  You were admitted for a right kidney infection (pyelonephritis) and bacteria in your blood stream (bacteremia).  You were treated with IV fluids and IV antibiotics and improved significantly.  You were discharged home in good condition with oral antibiotics for both your kidney infection and bacterial vaginosis.    Your instructions:  - please finish both your antibiotics (ciprofloxacin for kidney infection and metronidazole for BV) as prescribed  - please follow up with your primary care doctor with the next week to make sure you are continuing to improve      Recommended diet: Regular - No restrictions    Recommended activity: activity as tolerated    If you experience any of these symptoms within the first 24 hours after discharge:Uncontrolled pain, Shortness of breath, Fever of 101 F. or greater, Fever greater than 100.5, Chills, Poor urinary output, Vomiting, Nausea or Blood in stool  please follow up with the discharge attending Dr Delena Bali at phone-number: [phone]      If you experience any of the above symptoms 24 hours or more after discharge, please follow up with your PCP:  [name], MD [phone]            Discharge Instructions for Patients receiving   Contrast Medium    08/13/2015  1:23 AM    INFORMATION  I.V. contrast is eliminated through the kidneys.  Oral and rectal contrasts are not absorbed by the body and will be eliminated through the gastrointestinal tract.  Side effects and allergic reactions to contrast mediums are not common but can occur up to 48 hours after your scan.    INSTRUCTIONS   1. You will need to drink four 8 oz glasses of fluid over the next four hours, unless your medical condition does not allow you to do this.  Please speak with an [facility] if you have any questions or concerns about this.  2. Mild headache may occur following contrast  injection.  3. The kidneys excrete the contrast.  Your urine will NOT become discolored.  4. You may experience loose stools/diarrhea for approximately 24 hours after drinking oral contrast.  5. You may experience watery stool immediately after rectal contrast.  6.  If you are a DIABETIC and take Actos Plus Met, Actos Plus Met XR, Avandamet, Foramet, Glucophage, Glucophage XR, Glucovance, Glumetza, Janumet, Janumet XR, Jentadueto, [place], Kombiglyze XR, [place], [place], [place], [place], Xigduo XR or Metformin check with your doctor for management of your diabetes during this time.  Your doctor will have to address the need to hold this medication or the need for kidney function blood tests.   7.  There is no evidence that the contrast you have received would be harmful to your breastfed child.  If you choose, you may pump and discard your breast milk for the next 24 hours.    Delayed effects from contrast are not common.  However, notify your doctor IMMEDIATELY:    If nausea or vomiting occurs   If any itching, hives, wheezing, or rashes occur   Severe or throbbing headache    Monday - Friday 8am-5pm: For questions or concerns regarding your procedure please call [facility] ([facility] [phone].    After hours/Weekends:  [facility] patients may call [phone] and ask to speak with the Radiology resident on call.   [facility] patients may call [phone] to speak  with the [facility] nurse.  The Emergency Department is open 24 hours a day at [facility] if emergency treatment is required.

## 2015-08-13 NOTE — H&P (Signed)
General H&P for Inpatients    Chief Complaint: right flank pain    History of Present Illness:  HPI Comments: This is a 20 y/o female with PMHX anxiety and depression who presents with abdominal pain x 24 hours. Pt with right sided low back pain started on Wednesday. She felt awful all day with right flank pain radiating to her right lower abdomen, increased frequency of urination, no dysuria, she had fevers and shaking chills, some nausea, no vomiting, no diarrhea no hematurai. She has never had a kidney infection before. No hx kidney stones. She continued to feel awful with increasing flank and low abdominal pain and came to the ed.     Abdominal Pain   Associated symptoms include a fever, frequency, headaches, myalgias and nausea. Pertinent negatives include no diarrhea, dysuria, hematuria or vomiting.       Past Medical History:   Diagnosis Date    Anxiety     Depression      History reviewed. No pertinent surgical history.  Family History   Problem Relation Age of Onset    Diabetes Mother     No Known Problems Father     High cholesterol Paternal Aunt     Diabetes Paternal Aunt      Social History     Social History    Marital status: Single     Spouse name: N/A    Number of children: N/A    Years of education: N/A     Social History Main Topics    Smoking status: Never Smoker    Smokeless tobacco: Never Used    Alcohol use Yes    Drug use: No    Sexual activity: Yes     Partners: Male     Birth control/ protection: Condom, IUD     Other Topics Concern    None     Social History Narrative    June 2017:  Lives with boyfriend; IPV screen negative;  WOrks at [employer] in [place];  [place] at [employer] but did not finish;  Got GED at [employer];  Works also as a Armed forces logistics/support/administrative officer;         Allergies: No Known Allergies (drug, envir, food or latex)      (Not in a hospital admission)   Current Facility-Administered Medications   Medication Dose Route Frequency    sodium chloride 0.9%  bolus 1,000 mL   1,000 mL Intravenous Once    sodium chloride 0.9 % IV  125 mL/hr Intravenous Continuous     Current Outpatient Prescriptions   Medication    metroNIDAZOLE (FLAGYL) 500 MG tablet    omeprazole (PRILOSEC) 20 MG capsule       Review of Systems:   Review of Systems   Constitutional: Positive for chills, fever and malaise/fatigue.   Respiratory: Negative for cough and shortness of breath.    Cardiovascular: Negative for chest pain.   Gastrointestinal: Positive for abdominal pain and nausea. Negative for diarrhea and vomiting.   Genitourinary: Positive for flank pain, frequency and urgency. Negative for dysuria and hematuria.   Musculoskeletal: Positive for back pain and myalgias.   Skin: Negative for rash.   Neurological: Positive for headaches. Negative for dizziness.       Last Nursing documented pain:  0-10 Scale: 5 (08/12/15 2251)      Patient Vitals for the past 24 hrs:   BP Temp Temp src Pulse Resp SpO2 Height Weight   08/13/15 0233 93/50 - -  75 - - - -   08/13/15 0204 (!) 87/44 36.7 C (98.1 F) Oral 70 - 99 % - -   08/12/15 2200 - (!) 38.9 C (102 F) Oral - - - - -   08/12/15 2141 127/58 (!) 39.9 C (103.8 F) - 105 16 100 % 1.702 m ('5\' 7"' ) 59.9 kg (132 lb)     O2 Device: None (Room air) (08/13/15 8657)      Physical Exam   Constitutional: She is oriented to person, place, and time. She appears well-developed and well-nourished. No distress.   HENT:   Head: Normocephalic and atraumatic.   Mouth/Throat: Oropharynx is clear and moist.   Eyes: Conjunctivae are normal. Pupils are equal, round, and reactive to light. Right eye exhibits no discharge. Left eye exhibits no discharge. No scleral icterus.   Cardiovascular: Normal rate, regular rhythm and normal heart sounds.    No murmur heard.  Pulmonary/Chest: Effort normal and breath sounds normal.   Abdominal: Soft. Bowel sounds are normal. She exhibits no distension. There is tenderness (right lower quadrant and right flank). There is no rebound.    Musculoskeletal: She exhibits no edema or tenderness.   Neurological: She is alert and oriented to person, place, and time.   Skin: Skin is warm and dry. She is not diaphoretic.   Psychiatric: She has a normal mood and affect. Her behavior is normal. Judgment and thought content normal.       Lab Results:   All labs in the last 24 hours:   Recent Results (from the past 24 hour(s))   CBC and differential    Collection Time: 08/12/15  9:59 PM   Result Value Ref Range    WBC 14.2 (H) 4.0 - 10.0 THOU/uL    RBC 4.5 3.9 - 5.2 MIL/uL    Hemoglobin 12.6 11.2 - 15.7 g/dL    Hematocrit 38 34 - 45 %    MCV 83 79 - 95 fL    MCH 28 26 - 32 pg    MCHC 34 32 - 36 g/dL    RDW 13.8 11.7 - 14.4 %    Platelets 205 160 - 370 THOU/uL    Seg Neut % 83.8 %    Lymphocyte % 11.2 %    Monocyte % 4.4 %    Eosinophil % 0.1 %    Basophil % 0.1 %    Neut # K/uL 11.9 (H) 1.6 - 6.1 THOU/uL    Lymph # K/uL 1.6 1.2 - 3.7 THOU/uL    Mono # K/uL 0.6 0.2 - 0.9 THOU/uL    Eos # K/uL 0.0 0.0 - 0.4 THOU/uL    Baso # K/uL 0.0 0.0 - 0.1 THOU/uL    Nucl RBC % 0.0 0.0 - 0.2 /100 WBC    Nucl RBC # K/uL 0.0 0.0 - 0.0 THOU/uL    IMM Granulocytes # 0.1 0.0 - 0.1 THOU/uL    IMM Granulocytes 0.4 %   Basic metabolic panel    Collection Time: 08/12/15  9:59 PM   Result Value Ref Range    Glucose 96 60 - 99 mg/dL    Sodium 135 133 - 145 mmol/L    Potassium 4.0 3.3 - 5.1 mmol/L    Chloride 97 96 - 108 mmol/L    CO2 25 20 - 28 mmol/L    Anion Gap 13 7 - 16    UN 8 6 - 20 mg/dL    Creatinine 0.83 0.50 - 1.00 mg/dL  GFR,Caucasian 102 *    GFR,Black 117 *    Calcium 9.0 9.0 - 10.4 mg/dL   RUQ panel (ED only)    Collection Time: 08/12/15  9:59 PM   Result Value Ref Range    Amylase 62 28 - 100 U/L    Lipase 16 13 - 60 U/L    Total Protein 7.3 6.3 - 7.7 g/dL    Albumin 4.7 3.5 - 5.2 g/dL    Globulin 2.6 (L) 2.7 - 4.3 g/dL    Bilirubin,Total 0.8 0.0 - 1.2 mg/dL    Bilirubin,Direct 0.2 0.0 - 0.3 mg/dL    Alk Phos 95 50 - 130 U/L    AST 17 0 - 35 U/L    ALT 12 0 - 35 U/L    Blood bank hold lavender    Collection Time: 08/12/15  9:59 PM   Result Value Ref Range    Bld Bank Hld Lav Lav In Bld Bank    Lactate, venous, whole blood    Collection Time: 08/12/15  9:59 PM   Result Value Ref Range    Lactate VEN,WB 2.0 0.5 - 2.2 mmol/L   Blood culture    Collection Time: 08/12/15 10:00 PM   Result Value Ref Range    Bacterial Blood Culture .    Blood culture    Collection Time: 08/12/15 10:12 PM   Result Value Ref Range    Bacterial Blood Culture .    POCT urinalysis dipstick    Collection Time: 08/12/15 11:55 PM   Result Value Ref Range    Specific gravity,UA POCT Test Not Performed 1.002 - 1.03    PH,UA POCT 9.0 5 - 8    Leuk Esterase,UA POCT +2 (!) Negative    Nitrite,UA POCT Positive (!) Negative    Protein,UA POCT Trace (!) Negative mg/dL    Glucose,UA POCT Normal Normal    Ketones,UA POCT Negative Negative    Urobilinogen,UA      Bilirubin,Ur  Negative    Blood,UA POCT About 50 (!) Negative    Exp date 05/08/16     Lot # [id number]    POCT urine pregnancy    Collection Time: 08/12/15 11:55 PM   Result Value Ref Range    Preg Test,UR POC  Negative-Dilute urine specimens may cause false negative urine pregnancy results...     Negative-Dilute urine specimens may cause false negative urine pregnancy results...    INTERNAL CONTROL POCT URINE PREGNANCY *Yes-internal procedural control(s) acceptable     Exp date 06/09/16     Lot # [id number]    Urinalysis reflex to culture    Collection Time: 08/13/15 12:05 AM   Result Value Ref Range    Color, UA Yellow Yellow    Appearance,UR 1+ Cloudy (!) Clear    Specific Gravity,UA 1.009 1.001 - 1.030    Leuk Esterase,UA 2+ (!) NEGATIVE    Nitrite,UA POS (!) NEGATIVE    pH,UA 8.0 5.0 - 8.0    Protein,UA NEG NEGATIVE mg/dL    Glucose,UA NORM mg/dL    Ketones, UA NEG NEGATIVE    Blood,UA 1+ (!) NEGATIVE   Urine microscopic (iq200)    Collection Time: 08/13/15 12:05 AM   Result Value Ref Range    RBC,UA NONE SEEN 0 - 2 /hpf    WBC,UA >50 (!) 0 - 5 /hpf    Bacteria,UA  3+ (!)     Squam Epithel,UA 3+ (!) 0-1+ /hpf    Amorphous,UA Present  Radiology Impressions (last 3 days):  Ct Abdomen And Pelvis With Contrast    Result Date: 08/13/2015  IMPRESSION:  Focal region of low attenuation within the posterior mid right kidney with enhancement of the right ureteral wall and perinephric stranding, these findings are concerning represent pyelonephritis. There is no hydronephrosis.  The appendix is normal.  END REPORT   I have personally reviewed the image(s) and the resident's interpretation and agree with or edited the findings.      Currently Active/Followed Hospital Problems:  Active Hospital Problems    Diagnosis    Pyelonephritis       Assessment:   his is a 20 y/o female with PMHX anxiety and depression who presents with abdominal pain x 24 hours and in ED with fever, hypotension, leukocytosis, pyuria and CT scan with right sided findings consistent with pyelo. Started on IV flds and IV Ceftriaxone in the ED. Lactate 2.   Plan:   1. Pyelo- will cont IV flds, IV Ceftriaxone, follow cbc and intake/output. Tylenol for fever and pain meds PRN for pain. Follow renal function. Await urine and blood cultures.   2. BV- pt had not taken Flagyl since MOnday- she left it at her friends house in [place]. Will restart here.   Author: Acquanetta Belling, PA  Note created: 08/13/2015  at: 3:04 AM

## 2015-08-13 NOTE — Progress Notes (Signed)
Interdisciplinary Rounding Note     Nichole Carter was discussed today during health team rounds.     Problems addressed:  Active Problems:    Pyelonephritis      The plan of care was reviewed with the following health team members:  Charge Nurse  Social Worker  Physical Therapist    Mora Appl, RN 08/13/2015 1:21 PM

## 2015-08-13 NOTE — Progress Notes (Addendum)
Hospital PA Xcover    Received verbal sign out from long call resident to follow up on blood cx  + urine cx I+S.    2110 page from RN- patient with temp 38.9C, HR 127, BP 90/40. 1/2 Blood cx prelim growing GN rods from aerobic/anaerobic bottles drawn in ED. In review of past cx data, patient has grown out amp resistant ecoli; sensitive to current ABX- CTX.     Will repeat blood cx x2, lactate while patient is febrile. Then give patient APAP. Ordered 1L NS bolus. Continue with CTX. Ordered chlamydia dirty urine dip to be thorough; already covering gonorrhea.     FULL CODE  Eugenie Birks [facility] Medicine Division  Pager [phone] addendum  Urine cx growing > 100,000 ecoli, susceptibilities pending. Lactate 1.2. Will give toradol 30mg  IV x1 for pain.   Eugenie Birks [facility] Medicine Division  Pager [phone]

## 2015-08-14 LAB — CBC AND DIFFERENTIAL
Baso # K/uL: 0 10*3/uL (ref 0.0–0.1)
Basophil %: 0.1 %
Eos # K/uL: 0 10*3/uL (ref 0.0–0.4)
Eosinophil %: 0.1 %
Hematocrit: 30 % — ABNORMAL LOW (ref 34–45)
Hemoglobin: 10.1 g/dL — ABNORMAL LOW (ref 11.2–15.7)
IMM Granulocytes #: 0.1 10*3/uL (ref 0.0–0.1)
IMM Granulocytes: 0.5 %
Lymph # K/uL: 2.2 10*3/uL (ref 1.2–3.7)
Lymphocyte %: 13.7 %
MCH: 28 pg (ref 26–32)
MCHC: 33 g/dL (ref 32–36)
MCV: 84 fL (ref 79–95)
Mono # K/uL: 1.4 10*3/uL — ABNORMAL HIGH (ref 0.2–0.9)
Monocyte %: 8.9 %
Neut # K/uL: 12.4 10*3/uL — ABNORMAL HIGH (ref 1.6–6.1)
Nucl RBC # K/uL: 0 10*3/uL (ref 0.0–0.0)
Nucl RBC %: 0 /100 WBC (ref 0.0–0.2)
Platelets: 157 10*3/uL — ABNORMAL LOW (ref 160–370)
RBC: 3.6 MIL/uL — ABNORMAL LOW (ref 3.9–5.2)
RDW: 13.9 % (ref 11.7–14.4)
Seg Neut %: 76.7 %
WBC: 16.1 10*3/uL — ABNORMAL HIGH (ref 4.0–10.0)

## 2015-08-14 LAB — BASIC METABOLIC PANEL
Anion Gap: 12 (ref 7–16)
CO2: 19 mmol/L — ABNORMAL LOW (ref 20–28)
Calcium: 7.8 mg/dL — ABNORMAL LOW (ref 9.0–10.4)
Chloride: 109 mmol/L — ABNORMAL HIGH (ref 96–108)
Creatinine: 0.8 mg/dL (ref 0.50–1.00)
GFR,Black: 123 *
GFR,Caucasian: 106 *
Glucose: 85 mg/dL (ref 60–99)
Lab: 5 mg/dL — ABNORMAL LOW (ref 6–20)
Potassium: 4.1 mmol/L (ref 3.3–5.1)
Sodium: 140 mmol/L (ref 133–145)

## 2015-08-14 LAB — AEROBIC CULTURE

## 2015-08-14 NOTE — Progress Notes (Signed)
Family Medicine Progress Note  08/14/2015      LOS: 1 day      Hx: Nichole Carter is a 20 y.o. healthy female presenting with with R sided flank/abdominal pain, fever, chills, nausea, and urinary frequency x 2-3 days. Found to have R sided pyelo on CT, urine cx growing E. coli and bcx growing gram negative bacilli.    Subjective   Overnight: Spiked fever 39.0.   Was shivering and having myalgias last night with fever.  Feeling much better this morning.   Still has mild abdominal pain.  Currently denies fever, h/a, CP, SOB, abdominal pain, n/v/d.    Objective   Vital signs in last 24 hours:  Temp:  [36.7 C (98.1 F)-39 C (102.2 F)] 37.2 C (98.9 F)  Heart Rate:  [78-150] 98  Resp:  [14-18] 16  BP: (90-118)/(40-74) 110/70     Intake/Output last 24 hours    Intake/Output Summary (Last 24 hours) at 08/14/15 1358  Last data filed at 08/14/15 0859   Gross per 24 hour   Intake          6512.09 ml   Output             1200 ml   Net          5312.09 ml     Physical Exam:  General: lying in bed, pleasant, interactive, calm  HEENT: NCAT. Neck supple. Sclera anicteric. MMM. Clear oropharynx, no erythema or exudate.  Cardiovascular: regular rate and rhythm, no murmurs/rubs/gallops, normal S1 S2, distal pulses intact/symmetric  Pulmonary: clear breath sounds bilaterally without crackles, wheezes, or rhonchi  Abdomen: soft, flat, BS+, mild tenderness to palpation on R side, no masses palpated  Neurologic: awake, alert, appropriately conversant  Psych: baseline mental status, cooperative with exam  Skin: dry, no lesions or rashes noted  Extremities: Warm, well perfused, no cyanosis or edema    Patient Active Problem List   Diagnosis Code    Healthcare maintenance Z00.00    Shoulder pain M25.519    Pyelonephritis N12       Lab Results:    Recent Labs  Lab 08/14/15  0651 08/12/15  2159   Sodium 140 135   Potassium 4.1 4.0   Chloride 109* 97   CO2 19* 25   UN 5* 8   Creatinine 0.80 0.83   Glucose 85 96   Albumin  --  4.7       No  components found with this basename: CALCIUM      Recent Labs  Lab 08/12/15  2159   ALT 12   AST 17   Alk Phos 95   Bilirubin,Direct 0.2         Recent Labs  Lab 08/14/15  0651 08/12/15  2159   WBC 16.1* 14.2*   Hemoglobin 10.1* 12.6   Hematocrit 30* 38   Platelets 157* 205       No results for input(s): PTI, INR, PTT in the last 168 hours.    No results for input(s): PGLU in the last 168 hours.    Micro:  8/3 bcx - lactose fermenting gram negative bacilli  8/3 ucx - E. Coli, pan-sensitive    Imaging:   Ct Abdomen And Pelvis With Contrast    Result Date: 08/13/2015  IMPRESSION:  Focal region of decreased enhancement within the mid right kidney with enhancement of the right ureteral and perinephric infiltrative changes; findings are, associated with pyelonephritis. There is no hydronephrosis.  The appendix  is normal.  END REPORT   I have personally reviewed the image(s) and the resident's interpretation and agree with or edited the findings.         Active Hospital Medications:    metroNIDAZOLE  500 mg Oral BID    sodium chloride  3 mL Intravenous Q8H    enoxaparin  40 mg Subcutaneous Daily    pantoprazole  40 mg Oral QAM    ceftriaxone  1 g Intravenous Q24H       sodium chloride 200 mL/hr (08/14/15 1340)      acetaminophen, ondansetron, HYDROcodone-acetaminophen     Active Hospital Problems    Diagnosis    Pyelonephritis       Assessment   Nichole Carter is a healthy 20 y.o. female admitted for treatment of R sided pyelonephritis and bacteremia secondary to E. Coli.    Plan     #pyelonephritis, R kidney  -R flank pain, fever/chills, nausea, leukocytosis, CT evidence of pyelo  -secondary to E. Coli, pan-sensitive  -clinically improving  -ceftriaxone 1g IV daily - today is day #2  -continue IVF NS @ 200cc/hr  -acetaminophen 616m PO Q4H PRN mild pain  -dilaudid 0.554mIV Q3H PRN severe pain  -zofran for nausea    #bacterial vaginosis  -metronidazole 50067mO BID    F: IV NS @ 200cc/hr  E: daily  N: regular    F:  NS 200cc/hr  E: daily  N: PO    DVT Prophylaxis: lovenox    Discharge: Pending continued clinical improvement and at least 24 hours with no fever.    Author: TimSilverio DecampD, R1 [place]/05/2015 at 1:58 PM

## 2015-08-14 NOTE — Progress Notes (Signed)
Pt told PCT that she has been having difficulty breathing overnight. Pt was sleeping whenever writer went into room overnight. Pt c/o chest  tightness, but doesn't look to be in any distress. Lungs sounds clear bilaterally. O2 98% on RA. Gave pt incentive spirometer and encouraged her to use it multiple times every hour. Pt is only getting to right now. Covering provider made aware.     Tonny Branch RN

## 2015-08-15 DIAGNOSIS — R7881 Bacteremia: Secondary | ICD-10-CM

## 2015-08-15 HISTORY — DX: Bacteremia: R78.81

## 2015-08-15 LAB — BASIC METABOLIC PANEL
Anion Gap: 14 (ref 7–16)
CO2: 20 mmol/L (ref 20–28)
Calcium: 8.1 mg/dL — ABNORMAL LOW (ref 9.0–10.4)
Chloride: 106 mmol/L (ref 96–108)
Creatinine: 0.73 mg/dL (ref 0.50–1.00)
GFR,Black: 137 *
GFR,Caucasian: 119 *
Glucose: 85 mg/dL (ref 60–99)
Lab: 10 mg/dL (ref 6–20)
Potassium: 3.5 mmol/L (ref 3.3–5.1)
Sodium: 140 mmol/L (ref 133–145)

## 2015-08-15 LAB — CBC AND DIFFERENTIAL
Baso # K/uL: 0 10*3/uL (ref 0.0–0.1)
Basophil %: 0.1 %
Eos # K/uL: 0 10*3/uL (ref 0.0–0.4)
Eosinophil %: 0.3 %
Hematocrit: 27 % — ABNORMAL LOW (ref 34–45)
Hemoglobin: 9.2 g/dL — ABNORMAL LOW (ref 11.2–15.7)
IMM Granulocytes #: 0 10*3/uL (ref 0.0–0.1)
IMM Granulocytes: 0.3 %
Lymph # K/uL: 1.6 10*3/uL (ref 1.2–3.7)
Lymphocyte %: 13.7 %
MCH: 28 pg (ref 26–32)
MCHC: 34 g/dL (ref 32–36)
MCV: 81 fL (ref 79–95)
Mono # K/uL: 0.8 10*3/uL (ref 0.2–0.9)
Monocyte %: 7.3 %
Neut # K/uL: 9 10*3/uL — ABNORMAL HIGH (ref 1.6–6.1)
Nucl RBC # K/uL: 0 10*3/uL (ref 0.0–0.0)
Nucl RBC %: 0 /100 WBC (ref 0.0–0.2)
Platelets: 186 10*3/uL (ref 160–370)
RBC: 3.3 MIL/uL — ABNORMAL LOW (ref 3.9–5.2)
RDW: 13.7 % (ref 11.7–14.4)
Seg Neut %: 78.3 %
WBC: 11.5 10*3/uL — ABNORMAL HIGH (ref 4.0–10.0)

## 2015-08-15 LAB — BLOOD CULTURE

## 2015-08-15 MED ORDER — METRONIDAZOLE 500 MG PO TABS *I*
500.0000 mg | ORAL_TABLET | Freq: Two times a day (BID) | ORAL | 0 refills | Status: AC
Start: 2015-08-15 — End: 2015-08-20

## 2015-08-15 MED ORDER — CIPROFLOXACIN HCL 500 MG PO TABS *I*
500.0000 mg | ORAL_TABLET | Freq: Two times a day (BID) | ORAL | 0 refills | Status: AC
Start: 2015-08-15 — End: 2015-08-22

## 2015-08-15 NOTE — Progress Notes (Signed)
Family Medicine Progress Note  08/15/2015      LOS: 2 days      Hx: Nichole Carter is a 20 y.o. healthy female presenting with with R sided flank/abdominal pain, fever, chills, nausea, and urinary frequency x 2-3 days. Found to have R sided pyelo on CT, urine & blood cx growing E. coli that is pan-sensitive.    Subjective   High temp ~24hrs ago was 38  Today feels much improved and would like to go home.   Has no pain or urinary symptoms.  Tolerating po.   Currently denies fever, chills, h/a, CP, SOB, abdominal pain, n/v/d.    Objective   Vital signs in last 24 hours:  Temp:  [36.7 C (98.1 F)-38 C (100.4 F)] 37.3 C (99.2 F)  Heart Rate:  [85-103] 101  Resp:  [17] 17  BP: (110-130)/(68-78) 110/68     Intake/Output last 24 hours    Intake/Output Summary (Last 24 hours) at 08/15/15 0950  Last data filed at 08/15/15 0900   Gross per 24 hour   Intake             4050 ml   Output                0 ml   Net             4050 ml     Physical Exam:  General: lying in bed, pleasant, interactive, calm  HEENT: NCAT. Neck supple. Sclera anicteric. MMM.   Cardiovascular: regular rate and rhythm, no murmurs/rubs/gallops, normal S1 S2, distal pulses intact/symmetric  Pulmonary: clear breath sounds bilaterally without crackles, wheezes, or rhonchi  Abdomen: soft, flat, BS+, NTTP, no masses, no ecchymosis over abdomen/back/flanks, no CVA tenderness   Neurologic: awake, alert, appropriately conversant  Psych: baseline mental status, cooperative with exam  Skin: dry, no lesions or rashes noted  Extremities: Warm, well perfused, no cyanosis or edema    Patient Active Problem List   Diagnosis Code    Healthcare maintenance Z00.00    Shoulder pain M25.519    Pyelonephritis N12       Lab Results:    Recent Labs  Lab 08/15/15  0654 08/14/15  0651 08/12/15  2159   Sodium 140 140 135   Potassium 3.5 4.1 4.0   Chloride 106 109* 97   CO2 20 19* 25   UN 10 5* 8   Creatinine 0.73 0.80 0.83   Glucose 85 85 96   Albumin  --   --  4.7       No  components found with this basename: CALCIUM      Recent Labs  Lab 08/12/15  2159   ALT 12   AST 17   Alk Phos 95   Bilirubin,Direct 0.2         Recent Labs  Lab 08/15/15  0654 08/14/15  0651 08/12/15  2159   WBC 11.5* 16.1* 14.2*   Hemoglobin 9.2* 10.1* 12.6   Hematocrit 27* 30* 38   Platelets 186 157* 205       No results for input(s): PTI, INR, PTT in the last 168 hours.    No results for input(s): PGLU in the last 168 hours.    Micro:  8/3 bcx - E. Coli, pan-sensitive   8/3 ucx - E. Coli, pan-sensitive    Imaging:   Ct Abdomen And Pelvis With Contrast    Result Date: 08/13/2015  IMPRESSION:  Focal region of decreased enhancement within the  mid right kidney with enhancement of the right ureteral and perinephric infiltrative changes; findings are, associated with pyelonephritis. There is no hydronephrosis.  The appendix is normal.  END REPORT   I have personally reviewed the image(s) and the resident's interpretation and agree with or edited the findings.         Active Hospital Medications:    metroNIDAZOLE  500 mg Oral BID    sodium chloride  3 mL Intravenous Q8H    enoxaparin  40 mg Subcutaneous Daily    pantoprazole  40 mg Oral QAM    ceftriaxone  1 g Intravenous Q24H          acetaminophen, ondansetron, HYDROcodone-acetaminophen     Active Hospital Problems    Diagnosis    Pyelonephritis       Assessment   Nichole Carter is a healthy 20 y.o. female admitted for treatment of R sided pyelonephritis and bacteremia secondary to E. Coli.    Plan     #E. Coli with R pyelonephritis & bacteremia   -E. Coli pan-sensitive  -clinically improved significantly   -ceftriaxone 1g IV daily - today is day #3, consider switching to PO Cipro vs Bactrim for 10-14 day course  -Stop IVFs   -acetaminophen 640m PO Q4H PRN mild pain  -dilaudid 0.581mIV Q3H PRN severe pain, none used in last 24hrs +  -zofran for nausea    #bacterial vaginosis  -metronidazole 50024mO BID day #3, cont for 7 days total     F: PO  E: daily  N:  regular    DVT Prophylaxis: lovenox    Discharge: Likely home today with abx     Author: BohAstrid DraftsO, R3  08/15/2015 at 9:50 AM

## 2015-08-15 NOTE — Plan of Care (Signed)
Mobility     Patient's functional status is maintained or improved Adequate for discharge        Nutrition     Patient's nutritional status is maintained or improved Adequate for discharge        Pain/Comfort     Patient's pain or discomfort is manageable Adequate for discharge        Psychosocial     Demonstrates ability to cope with illness Adequate for discharge        Pt A&Ox3, ambulating independently in room. Adequate for discharge, no fever in 24 hrs. Will continue to monitor. Ola SpurrKayla Manaia Samad, RN

## 2015-08-15 NOTE — Discharge Summary (Signed)
Name: Nichole Carter MRN: [id number] DOB: 1995/03/26     Admit Date: 08/12/2015   Date of Discharge: 08/15/2015    Patient was accepted for discharge to   Home or Self Care [1]           Discharge Attending Physician: Fuller SongSCHULTZ, STEPHEN      Hospitalization Summary    CONCISE NARRATIVE:   20 yo F with [facility] with R sided flank/abdominal pain, fever, chills, nausea, and urinary frequency x 2-3 days. She was febrile and mildly tachycardic on presentation with elevated WBC count. CT abdo/pelvis suggestive of R pyelonephritis. Blood cx x2 and urine cx obtained then started on IV CTX, IVF, and pain control. Received Ceftriaxone 8/4-8/6, x3 doses, urine and blood cultures showed pan-sensitive E. Coli.  Significantly improved and sent home with 7 more days of cipro in addition to 5 more days of metronidazole for BV.    PCP to follow up Blood cultures and Gonorrhea / Chlamydia results         CT RESULTS:   CT ABD PELVIS W CONTRAST 08/13/15  Focal region of decreased enhancement within the mid right kidney with enhancement of the right ureteral and perinephric infiltrative changes; findings are, associated with pyelonephritis. There is no hydronephrosis.The appendix is normal.    PENDING BLOOD RESULTS: Blood Cultures from 8/4 & 8/3  OTHER PENDING TEST RESULTS: FU Gonorrhea and Chlamydia testing from 8/5  SIGNIFICANT MED CHANGES: Yes  Received Ceftriaxone 8/4-8/6, x3 doses   Home with:  Ciprofloxacin 500mg  po bid for 7 more days making total 10 day course of antibiotics for pyelo  Metronidazole 500mg  po bid for 5 more days for BV        Signed: Brihana Quickel, DO  On: 08/15/2015  at: 12:07 PM

## 2015-08-16 ENCOUNTER — Telehealth: Payer: Self-pay

## 2015-08-16 LAB — CHLAMYDIA PLASMID DNA AMPLIFICATION: Chlamydia Plasmid DNA Amplification: 0

## 2015-08-16 LAB — N. GONORRHOEAE DNA AMPLIFICATION: N. gonorrhoeae DNA Amplification: 0

## 2015-08-16 NOTE — Telephone Encounter (Signed)
Transitional Care Management Initial Contact     Discharge summary obtained and reviewed by care manager prior to contact.  Date of Admission: 08/12/15  Date of Discharge: 08/15/15  Reason for Admission : pyelonephritis  Discharged to: home  Medication Reconciliation: med list up to date as of 08/16/2015 4:57 PM    Patient is scheduled with Dr. Nicolasa DuckingPiotrowski for her transition of care appointment on 08/19/15    Patient reports she took some tylenol for abdominal cramping with good relief. She is voiding frequently and continuing to push fluids. She states she is having some labial swelling and swelling in her back "like I'm bloated in my back" and wondered if this is normal. She denies rash, systemic swelling, or SOB. CM will attempt to get an acute appointment for her to be seen prior to her visit on Thursday.

## 2015-08-17 ENCOUNTER — Ambulatory Visit: Attending: Family Medicine | Admitting: Family Medicine

## 2015-08-17 ENCOUNTER — Other Ambulatory Visit
Admission: RE | Admit: 2015-08-17 | Discharge: 2015-08-17 | Disposition: A | Discharge status: Home / Self Care | Payer: Self-pay | Source: Ambulatory Visit | Attending: Family Medicine | Admitting: Family Medicine

## 2015-08-17 ENCOUNTER — Encounter: Payer: Self-pay | Admitting: Family Medicine

## 2015-08-17 VITALS — BP 118/59 | HR 70 | Temp 98.8°F | Ht 67.0 in | Wt 135.0 lb

## 2015-08-17 DIAGNOSIS — N12 Tubulo-interstitial nephritis, not specified as acute or chronic: Secondary | ICD-10-CM

## 2015-08-17 DIAGNOSIS — D649 Anemia, unspecified: Secondary | ICD-10-CM

## 2015-08-17 LAB — COMPREHENSIVE METABOLIC PANEL
ALT: 35 U/L (ref 0–35)
AST: 30 U/L (ref 0–35)
Albumin: 3.9 g/dL (ref 3.5–5.2)
Alk Phos: 79 U/L (ref 50–130)
Anion Gap: 13 (ref 7–16)
Bilirubin,Total: 0.3 mg/dL (ref 0.0–1.2)
CO2: 26 mmol/L (ref 20–28)
Calcium: 9 mg/dL (ref 9.0–10.4)
Chloride: 105 mmol/L (ref 96–108)
Creatinine: 0.85 mg/dL (ref 0.50–1.00)
GFR,Black: 114 *
GFR,Caucasian: 99 *
Glucose: 80 mg/dL (ref 60–99)
Lab: 8 mg/dL (ref 6–20)
Potassium: 4.3 mmol/L (ref 3.3–5.1)
Sodium: 144 mmol/L (ref 133–145)
Total Protein: 6.2 g/dL — ABNORMAL LOW (ref 6.3–7.7)

## 2015-08-17 LAB — CBC AND DIFFERENTIAL
Baso # K/uL: 0.1 10*3/uL (ref 0.0–0.1)
Basophil %: 0.9 %
Eos # K/uL: 0.1 10*3/uL (ref 0.0–0.4)
Eosinophil %: 0.9 %
Hematocrit: 31 % — ABNORMAL LOW (ref 34–45)
Hemoglobin: 10.5 g/dL — ABNORMAL LOW (ref 11.2–15.7)
Lymph # K/uL: 1.4 10*3/uL (ref 1.2–3.7)
Lymphocyte %: 26.5 %
MCH: 27 pg/cell (ref 26–32)
MCHC: 34 g/dL (ref 32–36)
MCV: 81 fL (ref 79–95)
Mono # K/uL: 0.4 10*3/uL (ref 0.2–0.9)
Monocyte %: 7.1 %
Neut # K/uL: 3.3 10*3/uL (ref 1.6–6.1)
Nucl RBC # K/uL: 0 10*3/uL (ref 0.0–0.0)
Nucl RBC %: 0 /100 WBC (ref 0.0–0.2)
Platelets: 272 10*3/uL (ref 160–370)
RBC: 3.9 MIL/uL (ref 3.9–5.2)
RDW: 14 % (ref 11.7–14.4)
Seg Neut %: 64.6 %
WBC: 5 10*3/uL (ref 4.0–10.0)

## 2015-08-17 LAB — DIFF MANUAL: Diff Based On: 113 CELLS

## 2015-08-17 NOTE — Progress Notes (Signed)
Pt presents today for fu pyelonephritis:  Hospitalized;  Discharge day #2  No fever/chills or sweats;  Tolerating po well and increasing water hydration;  No dysuria; no hematuria;  Urine normal in quantity and quality;  No abdominal or back pain but feels bloated in back and abdomen;  Loose bm today; no brbpr no melena;  Has IUD in place    Exam: thin female nad  VS reviewed; weight stable;   No cvat;  Abdom: mild distension; nontender; no masses; no r/g; NABS  Extrem: no edema;    A/p:  1. Pyelonephritis : improving;  Routine instructions and alert s/s d/w pt and emphasized;   CBC and differential    Comprehensive metabolic panel   2. Anemia, unspecified type  CBC and differential    Comprehensive metabolic panel     Monitor loose stool for resolution;    Fu 2 weeks sooner prn;

## 2015-08-18 LAB — BLOOD CULTURE
Bacterial Blood Culture: 0
Bacterial Blood Culture: 0
Bacterial Blood Culture: 0

## 2015-08-19 ENCOUNTER — Ambulatory Visit: Payer: Self-pay | Admitting: Family Medicine

## 2015-08-31 ENCOUNTER — Ambulatory Visit: Admitting: Family Medicine

## 2015-10-13 ENCOUNTER — Ambulatory Visit: Admitting: Family Medicine

## 2015-10-13 ENCOUNTER — Encounter: Payer: Self-pay | Admitting: Emergency Medicine

## 2015-10-13 ENCOUNTER — Inpatient Hospital Stay
Admission: EM | Admit: 2015-10-13 | Discharge: 2015-10-18 | DRG: 871 | Disposition: A | Discharge status: Home / Self Care | Source: Ambulatory Visit | Attending: Family Medicine | Admitting: Family Medicine

## 2015-10-13 ENCOUNTER — Encounter: Payer: Self-pay | Admitting: Family Medicine

## 2015-10-13 ENCOUNTER — Other Ambulatory Visit: Payer: Self-pay | Admitting: Cardiology

## 2015-10-13 VITALS — BP 69/39 | HR 109 | Temp 98.2°F | Ht 66.93 in | Wt 129.0 lb

## 2015-10-13 DIAGNOSIS — A419 Sepsis, unspecified organism: Principal | ICD-10-CM | POA: Diagnosis present

## 2015-10-13 DIAGNOSIS — I959 Hypotension, unspecified: Secondary | ICD-10-CM

## 2015-10-13 DIAGNOSIS — D649 Anemia, unspecified: Secondary | ICD-10-CM | POA: Diagnosis present

## 2015-10-13 DIAGNOSIS — R7881 Bacteremia: Secondary | ICD-10-CM | POA: Insufficient documentation

## 2015-10-13 DIAGNOSIS — N12 Tubulo-interstitial nephritis, not specified as acute or chronic: Secondary | ICD-10-CM

## 2015-10-13 DIAGNOSIS — J189 Pneumonia, unspecified organism: Secondary | ICD-10-CM | POA: Diagnosis present

## 2015-10-13 DIAGNOSIS — J90 Pleural effusion, not elsewhere classified: Secondary | ICD-10-CM | POA: Diagnosis present

## 2015-10-13 DIAGNOSIS — N179 Acute kidney failure, unspecified: Secondary | ICD-10-CM | POA: Diagnosis present

## 2015-10-13 DIAGNOSIS — R0789 Other chest pain: Secondary | ICD-10-CM

## 2015-10-13 DIAGNOSIS — I38 Endocarditis, valve unspecified: Secondary | ICD-10-CM

## 2015-10-13 DIAGNOSIS — D509 Iron deficiency anemia, unspecified: Secondary | ICD-10-CM | POA: Diagnosis present

## 2015-10-13 DIAGNOSIS — E876 Hypokalemia: Secondary | ICD-10-CM | POA: Diagnosis not present

## 2015-10-13 LAB — CBC AND DIFFERENTIAL
Baso # K/uL: 0 10*3/uL (ref 0.0–0.1)
Basophil %: 0 %
Eos # K/uL: 0 10*3/uL (ref 0.0–0.4)
Eosinophil %: 0 %
Hematocrit: 28 % — ABNORMAL LOW (ref 34–45)
Hemoglobin: 9.2 g/dL — ABNORMAL LOW (ref 11.2–15.7)
Lymph # K/uL: 1.1 10*3/uL — ABNORMAL LOW (ref 1.2–3.7)
Lymphocyte %: 6 %
MCH: 27 pg (ref 26–32)
MCHC: 33 g/dL (ref 32–36)
MCV: 80 fL (ref 79–95)
Mono # K/uL: 0.7 10*3/uL (ref 0.2–0.9)
Monocyte %: 4 %
Neut # K/uL: 16.6 10*3/uL — ABNORMAL HIGH (ref 1.6–6.1)
Nucl RBC # K/uL: 0 10*3/uL (ref 0.0–0.0)
Nucl RBC %: 0 /100 WBC (ref 0.0–0.2)
Platelets: 170 10*3/uL (ref 160–370)
RBC: 3.5 MIL/uL — ABNORMAL LOW (ref 3.9–5.2)
RDW: 15.4 % — ABNORMAL HIGH (ref 11.7–14.4)
Seg Neut %: 85 %
WBC: 18.4 10*3/uL — ABNORMAL HIGH (ref 4.0–10.0)

## 2015-10-13 LAB — HIV-1/2 RAPID SCREEN AB - MEDICALLY URGENT: Rapid HIV 1&2: NEGATIVE

## 2015-10-13 LAB — URINALYSIS REFLEX TO CULTURE
Ketones, UA: NEGATIVE
Nitrite,UA: NEGATIVE
Protein,UA: NEGATIVE mg/dL
Specific Gravity,UA: 1.007 (ref 1.001–1.030)
pH,UA: 5 (ref 5.0–8.0)

## 2015-10-13 LAB — RBC MORPHOLOGY

## 2015-10-13 LAB — RUQ PANEL (ED ONLY)
ALT: 11 U/L (ref 0–35)
AST: 21 U/L (ref 0–35)
Albumin: 3.1 g/dL — ABNORMAL LOW (ref 3.5–5.2)
Alk Phos: 91 U/L (ref 35–105)
Amylase: 24 U/L — ABNORMAL LOW (ref 28–100)
Bilirubin,Direct: 0.3 mg/dL (ref 0.0–0.3)
Bilirubin,Total: 0.6 mg/dL (ref 0.0–1.2)
Globulin: 2.3 g/dL — ABNORMAL LOW (ref 2.7–4.3)
Lipase: 8 U/L — ABNORMAL LOW (ref 13–60)
Total Protein: 5.4 g/dL — ABNORMAL LOW (ref 6.3–7.7)

## 2015-10-13 LAB — BASIC METABOLIC PANEL
Anion Gap: 12 (ref 7–16)
CO2: 23 mmol/L (ref 20–28)
Calcium: 8.1 mg/dL — ABNORMAL LOW (ref 9.0–10.4)
Chloride: 102 mmol/L (ref 96–108)
Creatinine: 1.68 mg/dL — ABNORMAL HIGH (ref 0.50–1.00)
GFR,Black: 50 * — AB
GFR,Caucasian: 43 * — AB
Glucose: 94 mg/dL (ref 60–99)
Lab: 18 mg/dL (ref 6–20)
Potassium: 3.5 mmol/L (ref 3.3–5.1)
Sodium: 137 mmol/L (ref 133–145)

## 2015-10-13 LAB — DIFF MANUAL
Bands %: 5 % (ref 0–10)
Diff Based On: 100 CELLS

## 2015-10-13 LAB — HOLD BLUE

## 2015-10-13 LAB — BLOOD BANK HOLD LAVENDER

## 2015-10-13 LAB — LACTATE, VENOUS, WHOLE BLOOD
Lactate VEN,WB: 1.4 mmol/L (ref 0.5–2.2)
Lactate VEN,WB: 1.3 mmol/L (ref 0.5–2.2)

## 2015-10-13 LAB — HOLD LAVENDER

## 2015-10-13 LAB — URINE MICROSCOPIC (IQ200)

## 2015-10-13 LAB — PREGNANCY TEST, SERUM: Preg,Serum: NEGATIVE

## 2015-10-13 LAB — HOLD SST

## 2015-10-13 MED ORDER — SODIUM CHLORIDE 0.9 % IV SOLN WRAPPED *I*
125.0000 mL/h | Freq: Once | Status: AC
Start: 2015-10-13 — End: 2015-10-13
  Administered 2015-10-13: 125 mL/h via INTRAVENOUS

## 2015-10-13 MED ORDER — ONDANSETRON HCL 2 MG/ML IV SOLN *I*
4.0000 mg | Freq: Once | INTRAMUSCULAR | Status: AC | PRN
Start: 2015-10-13 — End: 2015-10-13

## 2015-10-13 MED ORDER — ONDANSETRON HCL 2 MG/ML IV SOLN *I*
4.0000 mg | Freq: Once | INTRAMUSCULAR | Status: AC
Start: 2015-10-13 — End: 2015-10-13
  Administered 2015-10-13: 4 mg via INTRAVENOUS
  Filled 2015-10-13: qty 2

## 2015-10-13 MED ORDER — SODIUM CHLORIDE 0.9 % IV BOLUS *I*
1000.0000 mL | Freq: Once | Status: AC
Start: 2015-10-13 — End: 2015-10-13
  Administered 2015-10-13: 1000 mL via INTRAVENOUS

## 2015-10-13 MED ORDER — PIPERACILLIN-TAZOBACTAM IN D5W 4.5 GM/100ML IV SOLN *I*
4.5000 g | Freq: Once | INTRAVENOUS | Status: AC
Start: 2015-10-13 — End: 2015-10-13
  Administered 2015-10-13: 4.5 g via INTRAVENOUS
  Filled 2015-10-13: qty 100

## 2015-10-13 MED ORDER — ACETAMINOPHEN 325 MG PO TABS *I*
650.0000 mg | ORAL_TABLET | Freq: Four times a day (QID) | ORAL | Status: DC | PRN
Start: 2015-10-13 — End: 2015-10-19
  Administered 2015-10-14 – 2015-10-17 (×7): 650 mg via ORAL
  Filled 2015-10-13 (×7): qty 2

## 2015-10-13 MED ORDER — PIPERACILLIN-TAZOBACTAM IN D5W 3.375 GM/50ML IV SOLN *I*
3.3750 g | Freq: Four times a day (QID) | INTRAVENOUS | Status: DC
Start: 2015-10-14 — End: 2015-10-15
  Administered 2015-10-13 – 2015-10-15 (×7): 3.375 g via INTRAVENOUS
  Filled 2015-10-13 (×11): qty 50

## 2015-10-13 MED ORDER — DOXYCYCLINE 100 MG / 110 ML NS *I*
100.0000 mg | Freq: Once | INTRAVENOUS | Status: AC
Start: 2015-10-13 — End: 2015-10-13
  Administered 2015-10-13: 100 mg via INTRAVENOUS
  Filled 2015-10-13: qty 100

## 2015-10-13 MED ORDER — ACETAMINOPHEN 500 MG PO TABS *I*
500.0000 mg | ORAL_TABLET | Freq: Once | ORAL | Status: DC | PRN
Start: 2015-10-13 — End: 2015-10-13

## 2015-10-13 MED ORDER — ENOXAPARIN SODIUM 40 MG/0.4ML IJ SOSY *I*
40.0000 mg | PREFILLED_SYRINGE | INTRAMUSCULAR | Status: DC
Start: 2015-10-14 — End: 2015-10-19
  Administered 2015-10-14 – 2015-10-17 (×4): 40 mg via SUBCUTANEOUS
  Filled 2015-10-13 (×4): qty 0.4

## 2015-10-13 MED ORDER — SODIUM CHLORIDE 0.9 % IV SOLN WRAPPED *I*
500.0000 mL/h | Freq: Once | Status: DC
Start: 2015-10-13 — End: 2015-10-18

## 2015-10-13 MED ORDER — SODIUM CHLORIDE 0.9 % IV BOLUS *I*
1000.0000 mL | Freq: Once | Status: AC
Start: 2015-10-13 — End: 2015-10-14
  Administered 2015-10-13: 1000 mL via INTRAVENOUS

## 2015-10-13 MED ORDER — KETOROLAC TROMETHAMINE 30 MG/ML IJ SOLN *I*
15.0000 mg | Freq: Once | INTRAMUSCULAR | Status: AC
Start: 2015-10-13 — End: 2015-10-13
  Administered 2015-10-13: 15 mg via INTRAVENOUS
  Filled 2015-10-13: qty 1

## 2015-10-13 MED ORDER — VANCOMYCIN HCL IN D5W 1250 MG/250 ML IV SOLN *I*
1250.0000 mg | Freq: Once | INTRAVENOUS | Status: AC
Start: 2015-10-13 — End: 2015-10-13
  Administered 2015-10-13: 1250 mg via INTRAVENOUS
  Filled 2015-10-13: qty 250

## 2015-10-13 NOTE — Progress Notes (Signed)
10/13/15 2300   UM Patient Class Review   Patient Class Review Inpatient

## 2015-10-13 NOTE — Progress Notes (Signed)
Subjective:     Patient ID: Nichole Carter is a 20 y.o. female.    HPI     20y woman w/ recent history of pyelonephritis on a recent admission 8/3-08/15/15 complicated by E.Coli bacteremia/sepsis s/p 3d  Of IV ceftriaxone comes into clinic today with similar symptoms since Thursday: right sided back pain + suprapubic pain, malaise, urinary frequency  New this time: chest pain with breathing, short of breath  +syncope yesterday  Loss of appetite and poor po intake since Thursday  Patient came into clinic today because of headaches that were new today  Patient accompanied by boyfriend    Nichole Carter  has a past medical history of Anxiety and Depression.  Nichole Carter has Healthcare maintenance; Shoulder pain; Pyelonephritis; Bacteremia; and Anemia on her problem list.  Nichole Carter  has no past surgical history on file.  Her family history includes Diabetes in her mother and paternal aunt; High cholesterol in her paternal aunt; No Known Problems in her father.  Nichole   Carter has a current medication list which includes the following prescription(s): omeprazole, and the following Facility-Administered Medications: sodium chloride.  Nichole Carter has No Known Allergies (drug, envir, food or latex).    Social Hx: denies recent drug use/IV drug use, no new sexual partner, denies unprotected sex    Review of Systems  General ROS: +chills, subjective fevers, negative for trauma, rash and recent weight change  ENT ROS: no discharge, production, ear pain, throat pain, enlarged lymph nodes  Respiratory ROS: no cough, +shortness of breath NO wheezing  Cardiovascular ROS: + sharp chest pain with breathing; SOB at rest  Gastrointestinal ROS: no abdominal pain, nausea, vomiting, change in bowel habits, or black or bloody stools  Genito-Urinary ROS: +urinary frequency; no dysuria, trouble voiding, or hematuria      Objective:  Vitals:    10/13/15 1532   BP: (!) 69/39   Pulse: 109   Temp: 36.8 C (98.2 F)   Weight: 58.5 kg (129 lb)   Height: 1.7 m (5'  6.93")   RR20  O2 Sat: 100% RA      Physical Exam  GENERAL APPEARANCE: pale, thin, Appears stated age, ill appearing, increased work of breathing   HEENT: PERRL, EOMI, oropharynx clear, nares patent b/l  LUNGS: Clear to auscultation b/l  HEART: HR106, regular rhythm, S1 & S2. +murmur in pulmonic area then absent on repeat exam, gallops, or rubs.  ABDOMEN: Normoactive BS, soft, non-tender, without organomegaly  Back: right sided CVA tenderness   EXTREMITIES: 2+ DP PT pulses b/l, no cyanosis  MSK: moving all limbs  NEUROLOGIC: Alert and oriented x3, cranial nerves II-XII intact, motor/sensory exam limited by malaise & effort, slow gait     BG 94  EKG sinus rhythm HR105 no PR depression or ST changes rightward axis Qtc 414ms      Assessment:      20yo F w/ hx pyelonephritis complicated by E. Coli septicemia s/p 3d IV ceftriaxone in Aug2017 presents to clinic with symptoms suspicious for recurrent pyelonephritis and sepsis. Concern for immunocompromised state/endocarditis/pulmonary embolism.       Plan:      IV fluid bolus NS 500cc x 1, supplemental oxygen until EMS arrives  Send to ED  Please consider getting HIV testing  [facility] report of possible [facility]  Nursing asked to call and notify ED of patient  Obtaining blood cultures not available in clinic before placing IV    Spent 30minutes with help of staff stabilizing patient  Nichole Robes, MD   FM-OB Fellow  Pager [phone]

## 2015-10-13 NOTE — H&P (Signed)
Hospital Medicine Admission Note    Full Code    Chief Complaint   Chest Pain, SOB    History of Present Illness   Nichole Carter is an 20 y.o. female with PMH pyelonephritis/E coli bacteremia (admitted 8/3-8/6) presents with chest pain, SOB.    Patient states she was in usual state of health until last Thursday (9/28) when she developed subjective fever, chest pain, and SOB.  Symptoms progressed over the weekend when she developed chills, rigors. She also became increasingly fatigued and lightheaded. On Tuesday, she reports syncopal episode shortly after standing from seated position.     [facility] with chest pain, SOB. At clinic, BP 69/39, Pulse 109, but afebrile. She received 1L NS bolus. She was then instructed to the ED due to concern for recurrence of pyelonephritis/sepsis. Chest pain is primarily located on right side. It is sharp, aching in quality, radiating to RUQ. Notes improvement in chest pain and SOB when leaning forward, and worsening chest pain, SOB when laying flat. She also notes aching pain in bilateral LE. "Feels like my legs keep falling asleep." Also complains of subjective edema in RLE.    She denies sick contacts. Denies IV drug use. Den[place] any recent travel outside [place]. Lives in city. No recent trips/hiking in woods. Denies recent history of rashes. Received tattoo 4-5 months ago otherwise no needle exposures. Denies dysuria/increased frequency. Notes dry heaving, but denies emesis. Denies family history of rheumatological disease (RA, SLE) on either side of the family.     ED Course:  She received Zosyn, Vancomycin and NS bolus x2. She remained afebrile. Per ED provider, she had right flank pain and right lower abdominal pain at that time. She was later placed on 2L O2 NC.    Review of Systems     Review of Systems   Constitutional: Positive for diaphoresis, fever and malaise/fatigue. Negative for chills.   HENT: Positive for sore throat. Negative for congestion.       Respiratory: Positive for cough and shortness of breath. Negative for sputum production and wheezing.    Cardiovascular: Positive for chest pain and palpitations. Negative for orthopnea and leg swelling.   Gastrointestinal: Positive for nausea and vomiting. Negative for abdominal pain, constipation and diarrhea.   Genitourinary: Negative for dysuria, frequency and urgency.   Musculoskeletal: Negative for back pain, joint pain and myalgias.   Skin: Negative for rash.   Neurological: Positive for weakness. Negative for dizziness, tingling and headaches.       Past Medical and Surgical History     Past Medical History:   Diagnosis Date    Anxiety     Depression      History reviewed. No pertinent surgical history.    Medications and Allergies       (Not in a hospital admission)  She has No Known Allergies (drug, envir, food or latex).    Social and Family History     Family History   Problem Relation Age of Onset    Diabetes Mother     No Known Problems Father     High cholesterol Paternal Aunt     Diabetes Paternal Aunt      Social History     Social History    Marital status: Single     Spouse name: N/A    Number of children: N/A    Years of education: N/A     Occupational History    Not on file.  Social History Main Topics    Smoking status: Never Smoker    Smokeless tobacco: Never Used    Alcohol use Yes    Drug use: No    Sexual activity: Yes     Partners: Male     Birth control/ protection: Condom, IUD     Social History Narrative    June 2017:  Lives with boyfriend; IPV screen negative;  WOrks at [employer] in [place];  [place] at [employer] but did not finish;  Got GED at [employer];  Works also as a Surveyor, quantity and Physical Exam   Temp:  [35.7 C (96.2 F)-37.7 C (99.8 F)] 37.7 C (99.8 F)  Heart Rate:  [91-134] 134  Resp:  [16-36] 32  BP: (67-131)/(39-86) 131/86  General: Diaphoretic, uncomfortable, but alert and orientated   HEENT: PERRL, extraocular movements  intact, oropharynx without erythema  Neck: No LAD  CV: Tachycardic, Regular rhythm, No rub, No murmur  Pulm: Tachypnea, Lungs CTA except for faint crackles at bases bilaterally, No wheezing  Abdomen: Soft, TTP RUQ, BS active, Minimal tenderness in LLQ, RLQ  Extremities: Warm, 2+ peripheral pulses, TTP along left calf, + Homan's sign, No LE edema  Skin: No rashes, No needle track marks  Neuro: A&O to conversation, face symmetric, speech/language WNL, moving all extremities  Laboratory Data     CBC:     Recent Labs  Lab 10/13/15  1634   WBC 18.4*   Hemoglobin 9.2*   Hematocrit 28*   Platelets 170   Seg Neut % 85.0   Lymphocyte % 6.0   Monocyte % 4.0   Eosinophil % 0.0      Coagulation:   No results for input(s): INR, PTT, PTI in the last 168 hours.    No components found with this basename: APTT     Electrolytes:     Recent Labs  Lab 10/13/15  1634   Sodium 137   Potassium 3.5   CO2 23   UN 18   Creatinine 1.68*   Glucose 94   Calcium 8.1*   No results for input(s): MG in the last 168 hours.   Additional Labs:   UA:  Recent Labs  Lab 10/13/15  1837   Color, UA Lt Yellow*   Appearance,UR CLEAR   Glucose,UA NORM   Specific Gravity,UA 1.007   Protein,UA NEG   Nitrite,UA NEG   Leuk Esterase,UA 1+*   RBC,UA 3 - 10*   WBC,UA 6 - 10*   Squam Epithel,UA 2+*       No components found with this basename: UKET, UGLD, UPH  Rapid HIV: Negative     Imaging/Additional Studies   CT abdomen and pelvis without contrast 10/4:  Ill-defined perinephric stranding, right greater than left, without   evidence for hydronephrosis. No discrete perinephric fluid collection   is present. In the appropriate clinical setting this could be seen   with underlying pyelonephritis and correlation with urinalysis is   suggested.    Suggestion of subserosal edema of the gallbladder wall which is   nonspecific and can be seen with underlying hepatocellular   dysfunction, reactive changes, hypoproteinemia, or acute inflammatory   or infectious changes. If  there is persistent clinical concern,   further evaluation with dedicated right upper quadrant ultrasound   could be considered.    Small amount of free fluid in the paracolic gutters and pelvis,   increased from August 13, 2015.    Patchy areas of airspace consolidation in the lung bases, right   greater than left, which are new compared August 13, 2015 and could   represent atelectasis or multifocal pneumonia in the appropriate   Setting.    Chest X-ray 10/4: No evidence of acute disease in the chest.  Impression and Plan   Patient is a 20 y.o. female with PMH pyelonephritis/E coli bacteremia (admitted 8/3-8/6) presents with chest pain, SOB, leukocytosis, tachypnea, tachycardia concerning for sepsis. Source may be 2/2 recurrent pyelonephritis (less likely given relatively normal U/A), PNA, Endocarditis. In addition, given persistent tachycardia, normal lung exam, and calf tenderness/pain on exam there is also concern for PE.    Sepsis - 2/2 recurrent pyelonephritis (less likely given relatively normal U/A), PNA, Endocarditis  - Blood cultures pending  - Strep/Legionella urine antigens  - Urine culture pending  - TTE pending to rule out endocarditis  - Rapid HIV negative  - Continue Vancomycin/Zosyn with pending work up  - Continue Doxycycline with pending work up    Chest Pain/SOB - May be 2/2 PNA but concern for PE, Pericarditis  - EKG without diffuse ST elevations, no friction rub on ausculation, but history potentially concerning for pericarditis  - Well's Score 4.5, D-Dimer likely to be elevated regardless in setting sepsis  - V/Q scan instead of CTA given acute kidney injury    Acute Kidney Injury - likely pre-renal etiology, but also concern for intrarenal etiology  - Continue IVF's  - Monitor I/Os  - BMP daily  - RBCs in U/A, reflex to miscropscopy    DVT Prophylaxis: Lovenox  Diet: Regular  Disposition: Pending above    Clemens Catholic, MD  Internal Medicine/Pediatrics   Pager: [phone]

## 2015-10-13 NOTE — Progress Notes (Signed)
Writer was asked by clinician to insert IV and start fluids. Writer placed a 22 g IV in pt's RAC and started a 500 ml bag of NS. IV will remain in place as EMS has been called [facility]. IV is CD&I and flushes well.

## 2015-10-13 NOTE — ED Notes (Signed)
Report Given To   Palau[place]ia, RN      Descriptive Sentence / Reason for Admission     Flank Pain (coming from UC. r/o sepsis. febrile, hypotensive and tachycardic. c/o right flank pain that radiates around abd. hx of pylo. see call in note)       Active Issues / Relevant Events     Low Blood pressure      To Do List          Anticipatory Guidance / Discharge Planning    Admit?

## 2015-10-13 NOTE — ED Notes (Signed)
10/13/15 1559   OTHER   ED Service Adult Call-in   PCP/Service Referral Dr Nicolasa DuckingPiotrowski   Pt Info note/Reason for sending hx pylo 2 mths ago; now c/o similar sx chest pain/tightness/pressure; shortnes of breath; BP 69/39 tachy at 109; temp 36.8 but claims to be febrile couple days ago; nausea; vomting; cough -PNA? doctor reports hearing new murmur - pericarditis?    Pt Coming from [facility]   Interventions prior to arrival Other (comment)  (500 normal saline )   Requested Evaluation By Dr Alvin CritchleyZhang OB fellow   [facility]

## 2015-10-13 NOTE — ED Provider Notes (Signed)
History     Chief Complaint   Patient presents with    Flank Pain     coming from UC. r/o sepsis. febrile, hypotensive and tachycardic. c/o right flank pain that radiates around abd. hx of pylo. see call in note     HPI Comments: 20 yo female with pmh of depression, anxiety c/o flank pain.  For about one week she has right flank pain and right lower abdominal pain, sharp, worse with movement, associated with nausea.  She is also having chest pain, diffuse across her upper chest, shortness of breath and mild headache.  In August she was admitted for sepsis due to pyelonephritis and she says she feels similarly.  She was seen at her PCP today was hypotensive and tachycardic, and there was concern for a murmur.      She denies IV drug abuse or history of kidney stones.      History provided by:  Patient, medical records, relative, significant other and EMS personnel    Past Medical History:   Diagnosis Date    Anxiety     Depression         History reviewed. No pertinent surgical history.  Family History   Problem Relation Age of Onset    Diabetes Mother     No Known Problems Father     High cholesterol Paternal Aunt     Diabetes Paternal Aunt        Social History    reports that she has never smoked. She has never used smokeless tobacco. She reports that she drinks alcohol. She reports that she currently engages in sexual activity and has had female partners. She reports using the following methods of birth control/protection: Condom and IUD. She reports that she does not use illicit drugs.    Living Situation     Questions Responses    Patient lives with [facility]     Caregiver for other family member     External Services     Employment     Domestic Violence Risk           Problem List     Patient Active Problem List   Diagnosis Code    Healthcare maintenance Z00.00    Shoulder pain M25.519    Recurrent pyelonephritis N12    Bacteremia R78.81    Anemia D64.9    Atypical chest pain R07.89     Pneumonia J18.9    Pyelonephritis N12    Sepsis A41.9       Review of Systems   Review of Systems   Constitutional: Positive for fever.   HENT: Negative for trouble swallowing.    Respiratory: Positive for shortness of breath.    Cardiovascular: Positive for chest pain.   Gastrointestinal: Positive for abdominal pain.   Endocrine: Positive for polyuria.   Genitourinary: Positive for dysuria and flank pain.   Musculoskeletal: Positive for back pain.   Skin: Negative for rash.   Allergic/Immunologic: Negative for immunocompromised state.   Neurological: Positive for headaches.   Hematological: Does not bruise/bleed easily.   Psychiatric/Behavioral: Negative for confusion.       Physical Exam       ED Triage Vitals   BP Heart Rate Heart Rate (via Pulse Ox) Resp Temp Temp src SpO2 O2 Device O2 Flow Rate   10/13/15 1615 10/13/15 1700 10/13/15 1615 10/13/15 1615 10/13/15 1615 10/13/15 1615 10/13/15 1615 10/13/15 1615 10/13/15 1615   78/48 102 100 20 36.7  C (98.1 F) TEMPORAL 100 % Nasal cannula 2 L/min      Weight           10/13/15 1615           58.5 kg (129 lb)                    Physical Exam   Constitutional: She is oriented to person, place, and time. She appears well-developed and well-nourished.   HENT:   Head: Normocephalic.   Dry oral mucosa   Eyes: No scleral icterus.   Cardiovascular: Regular rhythm and normal heart sounds.    Tachycardic  No murmur heard though she tachycardic   Pulmonary/Chest: Effort normal. No stridor. She has no wheezes.   Abdominal: Soft. There is tenderness. There is no rebound and no guarding.       Musculoskeletal: She exhibits tenderness (cva tenderness).   Neurological: She is alert and oriented to person, place, and time.   Skin: Skin is warm and dry. No rash noted. She is not diaphoretic.   Psychiatric: She has a normal mood and affect.   Nursing note and vitals reviewed.      Medical Decision Making        Initial Evaluation:  ED First Provider Contact     Date/Time Event User  Comments    10/13/15 1619 ED Provider First Contact Levern Pitter Initial Face to Face Provider Contact          Patient seen by me as above    Assessment:  20 y.o.female comes to the ED with flank and abdominal pain, chest pain and headache, with urinary symptoms    Differential Diagnosis includes pyelonephritis, pneumonia, endocarditis, anemia, dehydration, pregnancy.                       Plan:   Cbc, bmp, ruq, hcg, urinalysis, bcx, lactate, cxr, ekg, ct abd/pelvis  ivf for rehydration, vancomycin iv, zosyn iv, toradol iv, zofran iv    Wbc 18, 5% bands, cr 1.68, lactate normal, urine suggests uti. Ct shows hydronephrosis, no stone or abscess.  There is also evidence of multifocal pneumonia.  Will add doxycycline iv.    She recevied 2L IVF for rehydration and hypotension, BP improved to 100's, HR remains tachycardic.  Episode of hypoxia recorded but 100% on reassessment, due to nail polish with poor waveform.      She looks improved and feels better. Will repeat lactate and admit for further work up.      Critical Care    There is a high probability of imminent or life threatening deterioration due to  sepsis    This is secondary to sepsis    Acute interventions include IV medications including vancomycin, zosyn, doxycycline, zofran, toradol, > 2 liters of IVF, development of treatment plan with patient or surrogate, obtaining history from patient or surrogate, examination of patient, ordering and performing treatments and interventions, ordering and review of laboratory studies, ordering and review of radiographic studies, evaluation of the patient's response to treatment, review of old charts and discussion with primary provider    Exact time of critical care (exclusive of other billable procedures)  40 minutes        Bluford KaufmannJoseph Sayuri Rhames, DO           Bluford KaufmannPereira, Malonie Tatum, DO  10/13/15 2209

## 2015-10-13 NOTE — ED Notes (Signed)
Pt reports having right lower back pain that started Thursday with fevers/chills. Pt reports having hx of pilo. Pt denies urinary frequency or burning. IV placed, labs drawn, call bell in reach, ECG done, monitor placed.

## 2015-10-14 ENCOUNTER — Inpatient Hospital Stay

## 2015-10-14 LAB — ECHO COMPLETE
Aortic Arch Diameter: 2.1 cm
Aortic Diameter (mid tubular): 1.9 cm
Aortic Diameter (sinus of Valsalva): 2.2 cm
BMI: 23.4 kg/m2
BP Diastolic: 60 mmHg
BP Systolic: 90 mmHg
BSA: 1.79 m2
Deceleration Time - MV: 160 ms
E/A ratio: 3.4
Echo RV Stroke Work Index Estimate: 1057 mmHg•mL/m2
Heart Rate: 102 {beats}/min
Height: 67 in
IVC Diameter: 2 cm
LA Diameter BSA Index: 2 cm/m2
LA Diameter Height Index: 2.1 cm/m
LA Diameter: 3.5 cm
LA Systolic Vol BSA Index: 24 mL/m2
LA Systolic Vol Height Index: 25.3 mL/m
LA Systolic Volume: 43 mL
LV ASE Mass BSA Index: 71.5 gm/m2
LV ASE Mass Height 2.7 Index: 30.5 gm/m2.7
LV ASE Mass Height Index: 75.2 gm/m
LV ASE Mass: 128 gm
LV CO BSA Index: 2.51 L/min/m2
LV Cardiac Output: 4.49 L/min
LV Diastolic Volume Index: 44.7 mL/m2
LV Posterior Wall Thickness: 0.9 cm
LV SV - LVOT SV Diff: -7.79 mL
LV SV BSA Index: 24.6 mL/m2
LV SV Height Index: 25.9 mL/m
LV Septal Thickness: 0.9 cm
LV Stroke Volume: 44 mL
LV Systolic Volume Index: 20.1 mL/m2
LVED Diameter BSA Index: 2.5 cm/m2
LVED Diameter Height Index: 2.6 cm/m
LVED Diameter: 4.4 cm
LVED Volume BSA Index: 44.7 mL/m2
LVED Volume BSA Index: 45 ml/m2
LVED Volume Height Index: 47 mL/m
LVED Volume: 80 mL
LVEF (Volume): 55 %
LVES Diameter BSA Index: 1.7 cm/m2
LVES Diameter Height Index: 1.8 cm/m
LVES Diameter: 3.1 cm
LVES Volume BSA Index: 20 ml/m2
LVES Volume BSA Index: 20.1 mL/m2
LVES Volume Height Index: 21.2 mL/m
LVES Volume: 36 mL
LVOT Area (calculated): 2.43 cm2
LVOT Cardiac Index: 2.95 L/min/m2
LVOT Cardiac Output: 5.28 L/min
LVOT Diameter: 1.76 cm
LVOT PWD VTI: 21.3 cm
LVOT PWD Velocity (mean): 71.7 cm/s
LVOT PWD Velocity (peak): 105.5 cm/s
LVOT SV BSA Index: 28.93 mL/m2
LVOT SV Height Index: 30.4 mL/m
LVOT Stroke Rate (mean): 174.3 mL/s
LVOT Stroke Rate (peak): 256.5 mL/s
LVOT Stroke Volume: 51.79 cc
MR Regurgitant Fraction (LV SV Mtd): -0.18
MR Regurgitant Volume (LV SV Mtd): -7.8 mL
MV Peak A Velocity: 31.5 cm/s
MV Peak E Velocity: 107.2 cm/s
Mitral Annular E/Ea Vel Ratio: 8.93
Mitral Annular Ea Velocity: 12 cm/s
Peak Gradient - TR: 43 mmHg
Peak Velocity - TR: 326.64 cm/s
Pulmonary Vascular Resistance Estimate: 9.6 mmHg
RA Diameter (Medial-Lateral) BSA Index: 2.2 cm/m2
RA Diameter (Medial-Lateral) Height Index: 2.4 cm/m
RA Diameter (Medial-Lateral): 4 cm
RA Pressure Estimate: 5 mmHg
RR Interval: 588.24 ms
RV Peak Systolic Pressure: 48 mmHg
RVED Diameter BSA Index: 2.6 cm/m2
RVED Diameter Height Index: 2.8 cm/m
RVED Diameter: 4.68 cm
Weight (lbs): 149 [lb_av]
Weight: 2384 oz

## 2015-10-14 LAB — BASIC METABOLIC PANEL
Anion Gap: 14 (ref 7–16)
CO2: 17 mmol/L — ABNORMAL LOW (ref 20–28)
Calcium: 7.1 mg/dL — ABNORMAL LOW (ref 9.0–10.4)
Chloride: 108 mmol/L (ref 96–108)
Creatinine: 1.25 mg/dL — ABNORMAL HIGH (ref 0.50–1.00)
GFR,Black: 71 *
GFR,Caucasian: 62 *
Glucose: 104 mg/dL — ABNORMAL HIGH (ref 60–99)
Lab: 12 mg/dL (ref 6–20)
Potassium: 3.6 mmol/L (ref 3.3–5.1)
Sodium: 139 mmol/L (ref 133–145)

## 2015-10-14 LAB — CBC AND DIFFERENTIAL
Baso # K/uL: 0 10*3/uL (ref 0.0–0.1)
Basophil %: 0 %
Eos # K/uL: 0 10*3/uL (ref 0.0–0.4)
Eosinophil %: 0 %
Hematocrit: 24 % — ABNORMAL LOW (ref 34–45)
Hemoglobin: 8 g/dL — ABNORMAL LOW (ref 11.2–15.7)
Lymph # K/uL: 0.9 10*3/uL — ABNORMAL LOW (ref 1.2–3.7)
Lymphocyte %: 5.5 %
MCH: 27 pg (ref 26–32)
MCHC: 34 g/dL (ref 32–36)
MCV: 79 fL (ref 79–95)
Mono # K/uL: 0.2 10*3/uL (ref 0.2–0.9)
Monocyte %: 1.5 %
Neut # K/uL: 14.6 10*3/uL — ABNORMAL HIGH (ref 1.6–6.1)
Nucl RBC # K/uL: 0 10*3/uL (ref 0.0–0.0)
Nucl RBC %: 0 /100 WBC (ref 0.0–0.2)
Platelets: 169 10*3/uL (ref 160–370)
RBC: 3 MIL/uL — ABNORMAL LOW (ref 3.9–5.2)
RDW: 15.5 % — ABNORMAL HIGH (ref 11.7–14.4)
Seg Neut %: 78 %
WBC: 15.7 10*3/uL — ABNORMAL HIGH (ref 4.0–10.0)

## 2015-10-14 LAB — AEROBIC CULTURE: Aerobic Culture: 0

## 2015-10-14 LAB — PROCALCITONIN: Procalcitonin: 12.07 ng/mL — ABNORMAL HIGH (ref 0.00–0.09)

## 2015-10-14 LAB — ALBUMIN: Albumin: 2.4 g/dL — ABNORMAL LOW (ref 3.5–5.2)

## 2015-10-14 LAB — DIFF MANUAL
Bands %: 15 % (ref 0–10)
Diff Based On: 200 CELLS

## 2015-10-14 LAB — OCCULT BLOOD GASTRIC / DUODENUM: Occult Blood,Gastric: NEGATIVE

## 2015-10-14 LAB — POCT GLUCOSE: Glucose POCT: 94 mg/dL (ref 60–99)

## 2015-10-14 LAB — LEGIONELLA ANTIGEN, URINE: Legionella Antigen (Urine): 0

## 2015-10-14 LAB — S. PNEUMONIAE ANTIGEN: S. pneumoniae Antigen: POSITIVE — AB

## 2015-10-14 MED ORDER — DOXYCYCLINE 100 MG / 110 ML NS *I*
100.0000 mg | Freq: Two times a day (BID) | INTRAVENOUS | Status: DC
Start: 2015-10-15 — End: 2015-10-15
  Administered 2015-10-15: 100 mg via INTRAVENOUS
  Filled 2015-10-14 (×3): qty 100

## 2015-10-14 MED ORDER — SODIUM CHLORIDE 0.9 % IV SOLN WRAPPED *I*
125.0000 mL/h | Status: DC
Start: 2015-10-14 — End: 2015-10-17
  Administered 2015-10-14 – 2015-10-17 (×8): 125 mL/h via INTRAVENOUS

## 2015-10-14 MED ORDER — ONDANSETRON HCL 2 MG/ML IV SOLN *I*
4.0000 mg | Freq: Four times a day (QID) | INTRAMUSCULAR | Status: DC | PRN
Start: 2015-10-14 — End: 2015-10-19
  Administered 2015-10-14 – 2015-10-15 (×2): 4 mg via INTRAVENOUS
  Filled 2015-10-14 (×2): qty 2

## 2015-10-14 MED ORDER — SODIUM CHLORIDE 0.9 % IV BOLUS *I*
1000.0000 mL | Freq: Once | Status: AC
Start: 2015-10-14 — End: 2015-10-14
  Administered 2015-10-14: 1000 mL via INTRAVENOUS

## 2015-10-14 MED ORDER — VANCOMYCIN HCL IN D5W 1500 MG/250 ML IV SOLN *I*
1500.0000 mg | INTRAVENOUS | Status: DC
Start: 2015-10-14 — End: 2015-10-15
  Administered 2015-10-14: 1500 mg via INTRAVENOUS
  Filled 2015-10-14 (×2): qty 250

## 2015-10-14 NOTE — Progress Notes (Signed)
Patient upset with Clinical research associatewriter because Clinical research associatewriter asked boyfriend to get out of the bed with patient. Patient upset stating that she wants to go and doesn't want to stay. Explained to patient that she is really sick and she stated that she doesn't care and that if she needs to come back later she will. Would like to speak to a provider about signing paperwork to leave. Isaiah Blakesanya L Addelyn Alleman, RN

## 2015-10-14 NOTE — Plan of Care (Signed)
Mobility     Patient's functional status is maintained or improved Maintaining        Nutrition     Patient's nutritional status is maintained or improved Maintaining        Pain/Comfort     Patient's pain or discomfort is manageable Maintaining        Safety     Patient will remain free of falls Maintaining          Pt was able to walk independently to and from the bathroom and around the room. Pt ate 100% of her lunch. Pt reported her pain managed with PRN pain management and pt remained free of falls throughout shift.

## 2015-10-14 NOTE — Progress Notes (Signed)
Pt had bout of emesis. Pt reported itching and burning eyes. Provider was notified. A set of vitals was obtained.Pt was placed on 2.5L of O2 NC. Provider came to bedside. Culture of emesis was taken. A bolus was ordered and administered. Zosyn was hung. Tylenol was given for discomfort.

## 2015-10-14 NOTE — Plan of Care (Signed)
S: SOB/chest tightness remains with right sided RUQ pain.     O:     Vital signs in last 24 hours:  Temp:  [35.7 C (96.2 F)-38.5 C (101.3 F)] 37.6 C (99.7 F)  Heart Rate:  [91-134] 114  Resp:  [16-36] 24  BP: (83-131)/(42-86) 110/58    Procalcitonin: 12.07 (^)    Micro:   Bcx x2: NGTD     Urine cx: NGTD    Urine strep ag: POS    GC/CT DNA: pending    Radiology:  Nm Vq, Ventilation And Perfusion Scan    Result Date: 10/14/2015  IMPRESSION:  Low probability for pulmonary embolus.  END OF REPORT    Ct Abdomen And Pelvis Without Contrast    Result Date: 10/13/2015  IMPRESSION:  Ill-defined perinephric stranding, right greater than left, without evidence for hydronephrosis. No discrete perinephric fluid collection is present. In the appropriate clinical setting this could be seen with underlying pyelonephritis and correlation with urinalysis is suggested.  Suggestion of subserosal edema of the gallbladder wall which is nonspecific and can be seen with underlying hepatocellular dysfunction, reactive changes, hypoproteinemia, or acute inflammatory or infectious changes. If there is persistent clinical concern, further evaluation with dedicated right upper quadrant ultrasound could be considered.  Small amount of free fluid in the paracolic gutters and pelvis, increased from August 13, 2015.  Patchy areas of airspace consolidation in the lung bases, right greater than left, which are new compared August 13, 2015 and could represent atelectasis or multifocal pneumonia in the appropriate setting.  The intrauterine device appears to have migrated into the uterine body and lower uterine segment when compared to August 13, 2015.  END REPORT    I have personally reviewed the image(s) and the resident's interpretation and agree with or edited the findings.      *chest Standard Single View    Result Date: 10/13/2015  IMPRESSION:  No evidence of acute disease in the chest.  END OF REPORT      A/P:  [age] yof with recent hx of  pyelonephritis with pansensitive e.coli now presenting septic appearing yet afebrile with imaging and labs suggestive of recurrent pyelonephritis (possibly 2/2 med non-compliance) and health-care associated pneumonia. Hemodynamically stable with cultures pending    ID: possible pyelo with HCAP  - continue vanc/zosyn/doxy  - f/u cultures for identification/sensitivities, will narrow abx after return of cultures  - gc/ct ordered, will f/u    AKI: Likely pre-renal given hypotension and improvement in bun/cr with fluid bolus  - will continue to follow bmp for continued improvement  - continue fluid resuscitation    Derry Loryyler Ronan Duecker, MD  Family Medicine R2  10/14/2015  5:26 PM

## 2015-10-14 NOTE — Plan of Care (Signed)
Admission Skin Assessment  Admission Braden: 22  Current Braden: 20  Q2 hour turns: Independent  Foley catheter: N/A  Stool management device: None  Mattress: Blue Mattress  See below image for full skin assessment.      Erasmo DownerLyndsi Rajeev Escue, RN

## 2015-10-14 NOTE — Progress Notes (Signed)
Pharmacist Pharmacokinetic Note - Vancomycin    General  Patient was started on vancomycin for sepsis     First dose: 1250 mg once 10/4    Goal vancomycin trough concentration: 15-20 mcg/mL    Laboratory Data      Lab results: 10/14/15  0600 10/13/15  1634   WBC 15.7* 18.4*         Lab results: 10/14/15  0600 10/13/15  1634   Creatinine 1.25* 1.68*         Lab results: 10/14/15  0600 10/13/15  1634   UN 12 18           Vancomycin concentrations:    No results for input(s): VANCT, VANC in the last 720 hours.        Recommendation  Based on calculations, patient's renal function, and volume status, recommend the following dose: 1500 mg IV every 24 hours.     The next vancomycin trough concentration has been ordered to be drawn on 10/7    For questions contact pharmacy at extension [phone]      Elam Dutchyan Deisy Ozbun, PharmD

## 2015-10-14 NOTE — Progress Notes (Signed)
Patient inhaled 31.5 mCi Tc635m DTPA @ 2:49 for a lung ventilation study. Patient was injected with 5.35 mCi Tc425m MAA in the RAC IV @ 3:18 for a lung perfusion study.

## 2015-10-14 NOTE — Progress Notes (Signed)
CRA note:    02:35 am assisted with patient transfer from ED to VQ scan with VQ scan tech Leah.  Writer stayed with patient during procedure.  Patient remains on monitor.  VS per flow sheet.  Patient c/o ongoing chest discomfort while lying flat.  Mom requests to stay in procedural area during VQ scan.     03:30 am transferred patient on the monitor with CRA to W5.

## 2015-10-15 LAB — CBC AND DIFFERENTIAL
Baso # K/uL: 0 10*3/uL (ref 0.0–0.1)
Basophil %: 0 %
Eos # K/uL: 0.3 10*3/uL (ref 0.0–0.4)
Eosinophil %: 2 %
Hematocrit: 25 % — ABNORMAL LOW (ref 34–45)
Hemoglobin: 8.3 g/dL — ABNORMAL LOW (ref 11.2–15.7)
Lymph # K/uL: 1.1 10*3/uL — ABNORMAL LOW (ref 1.2–3.7)
Lymphocyte %: 8 %
MCH: 27 pg (ref 26–32)
MCHC: 34 g/dL (ref 32–36)
MCV: 79 fL (ref 79–95)
Mono # K/uL: 0.1 10*3/uL — ABNORMAL LOW (ref 0.2–0.9)
Monocyte %: 1 %
Neut # K/uL: 12.7 10*3/uL — ABNORMAL HIGH (ref 1.6–6.1)
Nucl RBC # K/uL: 0 10*3/uL (ref 0.0–0.0)
Nucl RBC %: 0 /100 WBC (ref 0.0–0.2)
Platelets: 212 10*3/uL (ref 160–370)
RBC: 3.1 MIL/uL — ABNORMAL LOW (ref 3.9–5.2)
RDW: 15.6 % — ABNORMAL HIGH (ref 11.7–14.4)
Seg Neut %: 83 %
WBC: 14.3 10*3/uL — ABNORMAL HIGH (ref 4.0–10.0)

## 2015-10-15 LAB — EKG 12-LEAD
P: 67 deg
PR: 176 ms
QRS: 92 deg
QRSD: 84 ms
QT: 336 ms
QTc: 436 ms
Rate: 101 {beats}/min
Statement: BORDERLINE
T: 110 deg

## 2015-10-15 LAB — BASIC METABOLIC PANEL
Anion Gap: 13 (ref 7–16)
CO2: 19 mmol/L — ABNORMAL LOW (ref 20–28)
Calcium: 7.5 mg/dL — ABNORMAL LOW (ref 9.0–10.4)
Chloride: 109 mmol/L — ABNORMAL HIGH (ref 96–108)
Creatinine: 1.17 mg/dL — ABNORMAL HIGH (ref 0.50–1.00)
GFR,Black: 77 *
GFR,Caucasian: 67 *
Glucose: 82 mg/dL (ref 60–99)
Lab: 10 mg/dL (ref 6–20)
Potassium: 3.2 mmol/L — ABNORMAL LOW (ref 3.3–5.1)
Sodium: 141 mmol/L (ref 133–145)

## 2015-10-15 LAB — DRUGS OF ABUSE SCRN,URINE
Amphetamine,UR: NEGATIVE
Barbiturate,UR: NEGATIVE
Benzodiazepinen,UR: NEGATIVE
Cocaine/Metab,UR: NEGATIVE
Opiates,UR: NEGATIVE
PCP,UR: NEGATIVE
THC Metabolite,UR: NEGATIVE
Tricyclics,UR: NEGATIVE

## 2015-10-15 LAB — DIFF MANUAL
Bands %: 6 % (ref 0–10)
Diff Based On: 100 CELLS

## 2015-10-15 LAB — N. GONORRHOEAE DNA AMPLIFICATION: N. gonorrhoeae DNA Amplification: 0

## 2015-10-15 LAB — CHLAMYDIA PLASMID DNA AMPLIFICATION: Chlamydia Plasmid DNA Amplification: 0

## 2015-10-15 MED ORDER — SALINE NASAL SPRAY 0.65 % NA SOLN *WRAPPED*
1.0000 | NASAL | Status: DC | PRN
Start: 2015-10-15 — End: 2015-10-19
  Administered 2015-10-15: 1 via NASAL
  Filled 2015-10-15: qty 44

## 2015-10-15 MED ORDER — CEFTRIAXONE SODIUM 2 G IN DEXTROSE 100 ML *I*
2.0000 g | INTRAVENOUS | Status: DC
Start: 2015-10-15 — End: 2015-10-19
  Administered 2015-10-15 – 2015-10-18 (×4): 2 g via INTRAVENOUS
  Filled 2015-10-15 (×5): qty 100

## 2015-10-15 NOTE — Progress Notes (Addendum)
Hospital Medicine Progress Note                                                Significant 24 Hour Events      Fever x1 to 38.6 overnight, emesis x2, NAE    Subjective      Feeling better this morning but with some headache. Chest pain is worse with movement/coughing but overall improved. 2/10 today. Abdominal pain improved. Reports emesis with nausea relieved by zofran. Reports some fever.     Objective    Vital signs in last 24 hours:  Temp:  [36.9 C (98.4 F)-38.6 C (101.4 F)] 37.1 C (98.8 F)  Heart Rate:  [94-125] 105  Resp:  [24-26] 26  BP: (88-138)/(42-74) 138/70    Exam:    General Appearance:    Alert, cooperative, no distress   HEENT:    NCAT, MMM   Lungs:     CTAB, respirations unlabored    Heart:    tachycardic, S1 and S2 normal, no m/r/g, 2+ radial pulses bilaterally   Abdomen:     Soft, minimally tender to palpation, bowel sounds normoactive, negative murphy's   Skin:   Warm and dry   Extremities:   No LE edema appreciated bilaterally   Neurologic:   Grossly intact, awake and alert     Labs:    Recent Labs  Lab 10/15/15  0217 10/14/15  0600 10/13/15  1634   Sodium 141 139 137   Potassium 3.2* 3.6 3.5   Chloride 109* 108 102   CO2 19* 17* 23   UN 10 12 18    Creatinine 1.17* 1.25* 1.68*   Calcium 7.5* 7.1* 8.1*       No components found with this basename: GLOM, GLUCOSE, PHOS    Recent Labs  Lab 10/15/15  0217 10/14/15  0600 10/13/15  1634   WBC 14.3* 15.7* 18.4*   Hemoglobin 8.3* 8.0* 9.2*   Hematocrit 25* 24* 28*   Platelets 212 169 170       No components found with this basename: NEUTOPHILPCT, MONOPCT,  EOSPCT  No results for input(s): INR, PTT in the last 168 hours.    No components found with this basename: PT    Recent Labs  Lab 10/13/15  1634   AST 21   ALT 11       No components found with this basename: PROT, ALBU, BILITOT, BILIDIR, ALKPHOS   No results for input(s): TRP, CK, CKMB in the last 168 hours.  No components found with this basename: GFR, CAUCASIAN    Other labs:    Recent  Labs  Lab 10/13/15  1536   Glucose POCT 94     Micro: bcx ngtd   Urine: ucx ngtd    Radiology:  Nm Vq, Ventilation And Perfusion Scan    Result Date: 10/14/2015  IMPRESSION:  Low probability for pulmonary embolus.  END OF REPORT    Ct Abdomen And Pelvis Without Contrast    Result Date: 10/13/2015  IMPRESSION:  Ill-defined perinephric stranding, right greater than left, without evidence for hydronephrosis. No discrete perinephric fluid collection is present. In the appropriate clinical setting this could be seen with underlying pyelonephritis and correlation with urinalysis is suggested.  Suggestion of subserosal edema of the gallbladder wall which is nonspecific and can be seen with underlying hepatocellular dysfunction, reactive  changes, hypoproteinemia, or acute inflammatory or infectious changes. If there is persistent clinical concern, further evaluation with dedicated right upper quadrant ultrasound could be considered.  Small amount of free fluid in the paracolic gutters and pelvis, increased from August 13, 2015.  Patchy areas of airspace consolidation in the lung bases, right greater than left, which are new compared August 13, 2015 and could represent atelectasis or multifocal pneumonia in the appropriate setting.  The intrauterine device appears to have migrated into the uterine body and lower uterine segment when compared to August 13, 2015.  END REPORT    I have personally reviewed the image(s) and the resident's interpretation and agree with or edited the findings.      *chest Standard Single View    Result Date: 10/13/2015  IMPRESSION:  No evidence of acute disease in the chest.  END OF REPORT        Current Meds:  Scheduled medications:   doxycycline IV  100 mg Intravenous BID    vancomycin  1,500 mg Intravenous Q24H    enoxaparin  40 mg Subcutaneous Q24H    piperacillin-tazobactam  3.375 g Intravenous Q6H     Continuous infusions:   sodium chloride 125 mL/hr (10/15/15 0352)     PRN medications:.    ondansetron  4 mg Intravenous Q6H PRN    acetaminophen  650 mg Oral Q6H PRN     Assessment   20 yof previously healthy with prior admission for pyelonephritis discharged on cipro x10 days total abx, now with signs of sepsis, questionable bilateral LL pna vs. Recurrent pyelonephritis. On broad spectrum abx, will consider narrowing.     Plan     Sepsis: pyelo vs. pna  - continue vanc/zosyn/doxy for now, will consult ID today re: abx choice  - f/u cultures  - f/u gc/ct  - acetaminophen for fever, zofran prn for nausea      DVT ppx: enoxaparin    Code Status: full    Dispo: pending abx narrowing and return of cultures      Author: Derry Loryyler Batey, MD  Note created: 10/15/2015  at: 12:10 PM   Resident, Family Medicine    Addendum 1410: Spoke with ID (Dr. Pennelope BrackenHeintz), who recommended the following:  -2g IV CTX over the weekend for CAP, low suspicion of pyelo  -If doing well next week, may transition to cefpodoxime for total antibx course of 14 days  -No additional imaging recommended at this time  -ID does not plan to follow daily at this time.    Wendee CoppMegan Elizabeth Makynna Manocchio, MD

## 2015-10-15 NOTE — Progress Notes (Signed)
10/15/15 1038 10/15/15 1045 10/15/15 1048   Vitals   Temp 37.1 C (98.8 F) 37.1 C (98.8 F) --    Temp src TEMPORAL Axillary --    Heart Rate 105 --  --    Heart Rate Source Automated/oximeter --  --    Resp (!) 26 --  --    BP 138/70 --  --    Oxygen Therapy   SpO2 (!) 86 % (!) 88 % 94 %   O2 Device None (Room air) Nasal cannula Nasal cannula   O2 Flow Rate --  2 L/min 3 L/min

## 2015-10-15 NOTE — Progress Notes (Signed)
Pt had bout of emisis, undigested food.  Pt given prn zofran, and resting comfrtably at present time.

## 2015-10-15 NOTE — Interdisciplinary Rounds (Signed)
Interdisciplinary Rounds Note    Date: 10/15/2015   Time: 11:24 AM   Attendance:  Registered Nurse and Social Worker    Admit Date/Time:  10/13/2015  4:17 PM    Principal Problem: <principal problem not specified>  Problem List:   Patient Active Problem List    Diagnosis Date Noted    Atypical chest pain 10/13/2015    Pneumonia 10/13/2015    Pyelonephritis 10/13/2015    Sepsis 10/13/2015    Anemia 08/17/2015    Bacteremia 08/15/2015    Recurrent pyelonephritis 08/13/2015    Shoulder pain 06/24/2015     June 2017 ortho eval:  Left shoulder subluxation, impingement-physical therapy and NSAIDS. We will see them back in 6-8 weeks if they fail to improve with conservative treatment.       Healthcare maintenance 06/15/2015       The patient's problem list and interdisciplinary care plan was reviewed.    Discharge Planning  Lives in: Multi-level apartment  Location of bedroom: 2nd level  Location of bathroom: 1st level  Lives With: Alone  Can they assist with pt needs after discharge?: Yes  *Does patient currently have home care services?: No     *Current External Services: None                      Plan    Anticipated Discharge Date:TBD     Discharge Disposition: Home

## 2015-10-15 NOTE — Student Note (Signed)
[facility] Progress Note                                                 LOS: 2 days     Significant 24 Hour Events      Had two bouts of nausea with emesis, relieved with Zofran. Temperature spiked to 38.6 C at 0533. Placed on 2.5 L of O2 via NC. Given Tylenol and IV bolus.    Subjective      Complaining of some SOB and right-sided chest pain that worsens with movement and coughing. Also complains of feeling hot and sweaty, asking for an ice pack. Denies nausea.    Objective      Vital Signs:    BP 118/62 (BP Location: Left arm)   Pulse (!) 122   Temp (!) 38.6 C (101.4 F) (Axillary)    Resp 24   Ht 1.702 m (5\' 7" )   Wt 66.1 kg (145 lb 12.8 oz)   SpO2 90%   BMI 22.84 kg/m2  BP: (88-118)/(42-74)   Temp:  [36.9 C (98.4 F)-38.6 C (101.4 F)]   Temp src: Axillary (10/06 0533)  Heart Rate:  [94-125]   Resp:  [24-28]   SpO2:  [83 %-96 %]   Height:  [170.2 cm (5\' 7" )]   Weight:  [66.1 kg (145 lb 12.8 oz)-67.6 kg (149 lb)]   O2 Device: Nasal cannula (10/15/15 0200)  O2 Flow Rate: 2 L/min (10/15/15 0200)     Intake/Output Summary (Last 24 hours) at 10/15/15 0921  Last data filed at 10/15/15 0356   Gross per 24 hour   Intake              930 ml   Output                0 ml   Net              930 ml        Physical Exam:    General Constitutional: NAD, AAOx3  HEENT: NCAT  Cardiovascular: Tachycardic, regular rhythm, S1, S2, no MRG  Lungs: Tachypneic with increased respiratory effort, speaking in short sentences, lungs CTAB  Abdomen: Soft, Flat, Non-Distended, NABS, tenderness in RUQ  Extremities: No edema, 2+ radial and dorsal pedis pulses  Neuro: sensory and motor grossly intact    Skin: Hot to the touch, dry, no rashes noted    Labs:    BMP remarkable for   - CO2 of 19  - Creatinine down-trending from 1.68 on admission to 1.17 this morning.  - Ca 7.5    CBC with diff remarkable for  - WBC down-trending from 18.4 on admission to 14.3 this morning.  - Bands down-trending from 15% to 6%  - H/H  8.3/25    Fecal occult blood negative    Procalcitonin on admission 12.07    Micro:  - Rapid HIV negative  - Urine Strep pneumo antigen positive  - Blood cultures with NGTD  - Urine cultures with NGTD  - Pending GC/CT    Imaging:     Nm Vq, Ventilation And Perfusion Scan    Result Date: 10/14/2015  IMPRESSION:  Low probability for pulmonary embolus.  END OF REPORT    Ct Abdomen And Pelvis Without Contrast    Result Date: 10/13/2015  IMPRESSION:  Ill-defined perinephric stranding,  right greater than left, without evidence for hydronephrosis. No discrete perinephric fluid collection is present. In the appropriate clinical setting this could be seen with underlying pyelonephritis and correlation with urinalysis is suggested.  Suggestion of subserosal edema of the gallbladder wall which is nonspecific and can be seen with underlying hepatocellular dysfunction, reactive changes, hypoproteinemia, or acute inflammatory or infectious changes. If there is persistent clinical concern, further evaluation with dedicated right upper quadrant ultrasound could be considered.  Small amount of free fluid in the paracolic gutters and pelvis, increased from August 13, 2015.  Patchy areas of airspace consolidation in the lung bases, right greater than left, which are new compared August 13, 2015 and could represent atelectasis or multifocal pneumonia in the appropriate setting.  The intrauterine device appears to have migrated into the uterine body and lower uterine segment when compared to August 13, 2015.  END REPORT    I have personally reviewed the image(s) and the resident's interpretation and agree with or edited the findings.      *chest Standard Single View    Result Date: 10/13/2015  IMPRESSION:  No evidence of acute disease in the chest.  END OF REPORT     *TTE  10/14/15   Normal left ventricular size and function.   Estimated LVEF is 55%.   Right ventricle is moderately enlarged with low normal systolic function.   Mild  pulmonic regurgitation.   Mild to moderate tricuspid regurgitation with estimated moderate pulmonary hypertension.   No evidence of endocarditis on transthoracic study.   No previous study is available for comparison.    Assessment     Pt is a 20 y.o. female with a PMHx of pyelonephritis and E coli sepsis, admitted 08/12/15-08/15/15, who presents with SOB, right-sided chest pain radiating into the RUQ, fever, tachypnea and tachycardia concerning for sepsis, possibly due to recurrent pyelonephritis and/or HAP. Blood cultures pending with NGTD. Started on vancomycin, zosyn, and doxycycline, as well as IVF for AKI.     Active Hospital Problems    Diagnosis    Pneumonia    Pyelonephritis    Sepsis      Resolved Hospital Problems    Diagnosis   No resolved problems to display.       Plan     1. Sepsis  - Continue vancomycin, zosyn, and doxycycline. Narrow antibiotics after cultures and sensitivities result.  - Blood and urine cultures pending with NGTD  - GC and CT pending  - O2 via NC until SpO2 > 92%  - Acetaminophen PRN for fever  - Ondansetron PRN for nausea/emesis    2. AKI  - Creatinine down-trending  - Continue mIVF with IV bolus PRN for changes in VS  - BMP daily      Fluids: mIVF: NS @ 125 mL/hr  Lytes: daily  Diet: regular diet    PPX:    DVT - Lovenox   PUD - none   Bowel - none     Last BM - 10/14/15     Lines / Tubes: peripheral IV day #1    Dispo Plan: pending blood cultures and clinical improvement    CODE STATUS: full code      *This is a Psychologist, occupational note. Please see MD note for final assessment and plan.    [name], MS3  10/15/2015 9:21 AM

## 2015-10-15 NOTE — Plan of Care (Signed)
Cognitive function    • Cognitive function will be maintained or return to baseline Completed or Resolved          Mobility    • Patient's functional status is maintained or improved Maintaining        Psychosocial    • Demonstrates ability to cope with illness Maintaining        Safety    • Patient will remain free of falls Maintaining    • Prevent any intentional injury Maintaining          Nutrition    • Patient's nutritional status is maintained or improved Progressing towards goal        Pain/Comfort    • Patient's pain or discomfort is manageable Progressing towards goal

## 2015-10-16 ENCOUNTER — Encounter: Payer: Self-pay | Admitting: Family Medicine

## 2015-10-16 DIAGNOSIS — N12 Tubulo-interstitial nephritis, not specified as acute or chronic: Secondary | ICD-10-CM | POA: Diagnosis present

## 2015-10-16 HISTORY — DX: Tubulo-interstitial nephritis, not specified as acute or chronic: N12

## 2015-10-16 LAB — URINALYSIS REFLEX TO CULTURE
Blood,UA: NEGATIVE
Ketones, UA: NEGATIVE
Nitrite,UA: NEGATIVE
Protein,UA: NEGATIVE mg/dL
Specific Gravity,UA: 1.006 (ref 1.001–1.030)
pH,UA: 5 (ref 5.0–8.0)

## 2015-10-16 LAB — CBC AND DIFFERENTIAL
Baso # K/uL: 0 10*3/uL (ref 0.0–0.1)
Basophil %: 0 %
Eos # K/uL: 0.1 10*3/uL (ref 0.0–0.4)
Eosinophil %: 1 %
Hematocrit: 22 % — ABNORMAL LOW (ref 34–45)
Hemoglobin: 7.4 g/dL — ABNORMAL LOW (ref 11.2–15.7)
Lymph # K/uL: 2.3 10*3/uL (ref 1.2–3.7)
Lymphocyte %: 31 %
MCH: 26 pg (ref 26–32)
MCHC: 34 g/dL (ref 32–36)
MCV: 78 fL — ABNORMAL LOW (ref 79–95)
Mono # K/uL: 0.7 10*3/uL (ref 0.2–0.9)
Monocyte %: 10 %
Neut # K/uL: 4.3 10*3/uL (ref 1.6–6.1)
Nucl RBC # K/uL: 0 10*3/uL (ref 0.0–0.0)
Nucl RBC %: 0 /100 WBC (ref 0.0–0.2)
Platelets: 211 10*3/uL (ref 160–370)
RBC: 2.9 MIL/uL — ABNORMAL LOW (ref 3.9–5.2)
RDW: 15.4 % — ABNORMAL HIGH (ref 11.7–14.4)
Seg Neut %: 58 %
WBC: 7.4 10*3/uL (ref 4.0–10.0)

## 2015-10-16 LAB — BASIC METABOLIC PANEL
Anion Gap: 13 (ref 7–16)
CO2: 19 mmol/L — ABNORMAL LOW (ref 20–28)
Calcium: 7.5 mg/dL — ABNORMAL LOW (ref 9.0–10.4)
Chloride: 110 mmol/L — ABNORMAL HIGH (ref 96–108)
Creatinine: 1.08 mg/dL — ABNORMAL HIGH (ref 0.50–1.00)
GFR,Black: 85 *
GFR,Caucasian: 74 *
Glucose: 82 mg/dL (ref 60–99)
Lab: 6 mg/dL (ref 6–20)
Potassium: 3 mmol/L — ABNORMAL LOW (ref 3.3–5.1)
Sodium: 142 mmol/L (ref 133–145)

## 2015-10-16 LAB — DIFF MANUAL: Diff Based On: 100 CELLS

## 2015-10-16 LAB — URINE MICROSCOPIC (IQ200): RBC,UA: NONE SEEN /hpf (ref 0–2)

## 2015-10-16 LAB — RETICULOCYTES
Retic %: 0.2 % — ABNORMAL LOW (ref 0.7–2.1)
Retic Abs: 4.5 10*3/uL — ABNORMAL LOW (ref 29.9–90.9)

## 2015-10-16 LAB — RBC MORPHOLOGY

## 2015-10-16 LAB — FERRITIN: Ferritin: 94 ng/mL (ref 10–120)

## 2015-10-16 LAB — TRANSFERRIN: Transferrin: 134 mg/dL — ABNORMAL LOW (ref 200–360)

## 2015-10-16 LAB — IRON: Iron: 19 ug/dL — ABNORMAL LOW (ref 34–165)

## 2015-10-16 MED ORDER — IOHEXOL 350 MG/ML (OMNIPAQUE) IV SOLN *I*
1.0000 mL | Freq: Once | INTRAVENOUS | Status: AC
Start: 2015-10-16 — End: 2015-10-16
  Administered 2015-10-16: 47 mL via INTRAVENOUS

## 2015-10-16 MED ORDER — FERROUS SULFATE 325 (65 FE) MG PO TABS *WRAPPED* *I*
325.0000 mg | ORAL_TABLET | Freq: Every day | ORAL | Status: DC
Start: 2015-10-16 — End: 2015-10-19
  Administered 2015-10-16 – 2015-10-18 (×3): 325 mg via ORAL
  Filled 2015-10-16 (×3): qty 1

## 2015-10-16 NOTE — Progress Notes (Signed)
Hospital Medicine Progress Note                                                Significant 24 Hour Events      UDS negative    Elevated temp to 38.2 overnight thought to be environmental  This morning, temp to 38.3    Subjective      Patient with no complains this morning. Denies chills. No SOB, CP, abd pain, N/V. Admits to sore throat. Decreased appetite but was able to eat some fruit last night.     Objective    Vital signs in last 24 hours:  Temp:  [36.8 C (98.2 F)-38.3 C (101 F)] 37.8 C (100.1 F)  Heart Rate:  [75-114] 97  Resp:  [20-26] 20  BP: (118-140)/(68-80) 140/80    Exam:    General Appearance:    Alert, cooperative, no distress   HEENT:    NCAT, MMM   Lungs:     CTAB, respirations unlabored    Heart:   RRR, S1 and S2 normal, no m/r/g, 2+ radial pulses bilaterally   Abdomen:     Soft, nontender, bowel sounds normoactive   Skin:   Warm and dry   Extremities:   No LE edema appreciated bilaterally   Neurologic:   Grossly intact, awake and alert     Labs:    Recent Labs  Lab 10/16/15  0436 10/15/15  0217 10/14/15  0600   Sodium 142 141 139   Potassium 3.0* 3.2* 3.6   Chloride 110* 109* 108   CO2 19* 19* 17*   UN 6 10 12    Creatinine 1.08* 1.17* 1.25*   Calcium 7.5* 7.5* 7.1*       No components found with this basename: GLOM, GLUCOSE, PHOS    Recent Labs  Lab 10/16/15  0436 10/15/15  0217 10/14/15  0600   WBC 7.4 14.3* 15.7*   Hemoglobin 7.4* 8.3* 8.0*   Hematocrit 22* 25* 24*   Platelets 211 212 169       No components found with this basename: NEUTOPHILPCT, MONOPCT,  EOSPCT  No results for input(s): INR, PTT in the last 168 hours.    No components found with this basename: PT    Recent Labs  Lab 10/13/15  1634   AST 21   ALT 11       No components found with this basename: PROT, ALBU, BILITOT, BILIDIR, ALKPHOS   No results for input(s): TRP, CK, CKMB in the last 168 hours.  No components found with this basename: GFR, CAUCASIAN    Other labs:    Recent Labs  Lab 10/13/15  1536   Glucose POCT 94          Micro: bcx ngtd   Urine: ucx ngtd    Radiology:  Nm Vq, Ventilation And Perfusion Scan    Result Date: 10/14/2015  IMPRESSION:  Low probability for pulmonary embolus.  END OF REPORT    Ct Abdomen And Pelvis Without Contrast    Result Date: 10/13/2015  IMPRESSION:  Ill-defined perinephric stranding, right greater than left, without evidence for hydronephrosis. No discrete perinephric fluid collection is present. In the appropriate clinical setting this could be seen with underlying pyelonephritis and correlation with urinalysis is suggested.  Suggestion of subserosal edema of the gallbladder wall which is nonspecific and can  be seen with underlying hepatocellular dysfunction, reactive changes, hypoproteinemia, or acute inflammatory or infectious changes. If there is persistent clinical concern, further evaluation with dedicated right upper quadrant ultrasound could be considered.  Small amount of free fluid in the paracolic gutters and pelvis, increased from August 13, 2015.  Patchy areas of airspace consolidation in the lung bases, right greater than left, which are new compared August 13, 2015 and could represent atelectasis or multifocal pneumonia in the appropriate setting.  The intrauterine device appears to have migrated into the uterine body and lower uterine segment when compared to August 13, 2015.  END REPORT    I have personally reviewed the image(s) and the resident's interpretation and agree with or edited the findings.      *chest Standard Single View    Result Date: 10/13/2015  IMPRESSION:  No evidence of acute disease in the chest.  END OF REPORT        Current Meds:  Scheduled medications:   ceftriaxone  2 g Intravenous Q24H    enoxaparin  40 mg Subcutaneous Q24H     Continuous infusions:   sodium chloride 125 mL/hr (10/16/15 0008)     PRN medications:.   ondansetron  4 mg Intravenous Q6H PRN    acetaminophen  650 mg Oral Q6H PRN     Assessment   20 yof previously healthy with prior admission for  pyelonephritis discharged on cipro x10 days total abx, now with signs of sepsis, questionable bilateral LL pna vs. recurrent pyelonephritis. On CTX for now    Plan     Sepsis: pyelo vs. pna  - Continue 2g IV CTX for CAP, low suspicious for pyelo. If doing well next week, may transition to cefpodoxime for total antibx course of 14 days  - blood and urine cx from 10/4 NGTD  - repeat blood and urine cx today  - WBC downtrending, 7.4 today from 14.3 yesterday  - gc/ct negative  - acetaminophen for fever, zofran prn for nausea    Anemia  -Low iron, started daily ferrous sulfate 325mg   -Hct 22 from 25 yday, no source of active bleeding. Will continue to monitor.    Acute kidney injury, improved  -Continue IVF- NS @125     DVT ppx: enoxaparin    Code Status: full    Dispo: pending clinical improvement    Author: Lennie Odor, MD  Note created: 10/16/2015  at: 8:59 AM   Resident, Family Medicine

## 2015-10-16 NOTE — Progress Notes (Signed)
Report Given To  Kenney Housemananya, RN      Descriptive Sentence / Reason for Admission   Report to Riki RuskJeremy RN at handoff    Flank Pain (coming from UC. r/o sepsis. febrile, hypotensive and tachycardic. c/o right flank pain that radiates around abd. hx of pylo. see call in note)       Active Issues / Relevant Events   Oxygen? Room air to 3L NC humidified O2.  IVF? NaCl @ 125 ml/hr, Rocephin IV   Lines/Drains? PIV (R) x2  Tele? Yes ST/NSR  Pertinent Labs?   Other Issues? SOB at times with ambulating          To Do List    Frequency of:  VS: 4  Pain: none  Bed Alarm?  No Zone?   BG? no  Turn?independent  Incontinence Check? N/a  Weight? qday  I/O? daily  Labs?       Anticipatory Guidance / Discharge Planning  What is the next step?  Expected D/C disposition? IV antibiotics  Barriers to D/C?

## 2015-10-17 ENCOUNTER — Other Ambulatory Visit: Payer: Self-pay | Admitting: Cardiology

## 2015-10-17 DIAGNOSIS — J90 Pleural effusion, not elsewhere classified: Secondary | ICD-10-CM | POA: Diagnosis present

## 2015-10-17 LAB — BASIC METABOLIC PANEL
Anion Gap: 12 (ref 7–16)
Anion Gap: 11 (ref 7–16)
CO2: 17 mmol/L — ABNORMAL LOW (ref 20–28)
CO2: 23 mmol/L (ref 20–28)
Calcium: 5.4 mg/dL — CL (ref 9.0–10.4)
Calcium: 8.1 mg/dL — ABNORMAL LOW (ref 9.0–10.4)
Chloride: 116 mmol/L — ABNORMAL HIGH (ref 96–108)
Chloride: 109 mmol/L — ABNORMAL HIGH (ref 96–108)
Creatinine: 0.68 mg/dL (ref 0.50–1.00)
Creatinine: 0.86 mg/dL (ref 0.50–1.00)
GFR,Black: 145 *
GFR,Black: 112 *
GFR,Caucasian: 126 *
GFR,Caucasian: 97 *
Glucose: 73 mg/dL (ref 60–99)
Glucose: 65 mg/dL (ref 60–99)
Lab: <4 mg/dL — ABNORMAL LOW (ref 6–20)
Lab: 4 mg/dL — ABNORMAL LOW (ref 6–20)
Potassium: 2.2 mmol/L — CL (ref 3.3–5.1)
Potassium: 4.4 mmol/L (ref 3.3–5.1)
Sodium: 145 mmol/L (ref 133–145)
Sodium: 143 mmol/L (ref 133–145)

## 2015-10-17 LAB — RBC MORPHOLOGY

## 2015-10-17 LAB — ALBUMIN
Albumin: 1.8 g/dL — ABNORMAL LOW (ref 3.5–5.2)
Albumin: 2.9 g/dL — ABNORMAL LOW (ref 3.5–5.2)

## 2015-10-17 LAB — PREALBUMIN: Prealbumin: <4 mg/dL — ABNORMAL LOW (ref 20–40)

## 2015-10-17 LAB — CBC AND DIFFERENTIAL
Baso # K/uL: 0 10*3/uL (ref 0.0–0.1)
Basophil %: 0 %
Eos # K/uL: 0.1 10*3/uL (ref 0.0–0.4)
Eosinophil %: 2 %
Hematocrit: 21 % — ABNORMAL LOW (ref 34–45)
Hemoglobin: 7 g/dL — ABNORMAL LOW (ref 11.2–15.7)
Lymph # K/uL: 2.3 10*3/uL (ref 1.2–3.7)
Lymphocyte %: 36 %
MCH: 26 pg (ref 26–32)
MCHC: 34 g/dL (ref 32–36)
MCV: 78 fL — ABNORMAL LOW (ref 79–95)
Mono # K/uL: 0.4 10*3/uL (ref 0.2–0.9)
Monocyte %: 6 %
Neut # K/uL: 3.5 10*3/uL (ref 1.6–6.1)
Nucl RBC # K/uL: 0 10*3/uL (ref 0.0–0.0)
Nucl RBC %: 0 /100 WBC (ref 0.0–0.2)
Platelets: 191 10*3/uL (ref 160–370)
RBC: 2.7 MIL/uL — ABNORMAL LOW (ref 3.9–5.2)
RDW: 15.5 % — ABNORMAL HIGH (ref 11.7–14.4)
Seg Neut %: 55 %
WBC: 6.3 10*3/uL (ref 4.0–10.0)

## 2015-10-17 LAB — DIFF MANUAL
Bands %: 1 % (ref 0–10)
Diff Based On: 100 CELLS

## 2015-10-17 LAB — AEROBIC CULTURE: Aerobic Culture: 0

## 2015-10-17 LAB — MAGNESIUM
Magnesium: 1.1 mEq/L — ABNORMAL LOW (ref 1.3–2.1)
Magnesium: 1.7 mEq/L (ref 1.3–2.1)

## 2015-10-17 LAB — PROTIME-INR

## 2015-10-17 MED ORDER — POTASSIUM CHLORIDE CRYS CR 20 MEQ PO TBCR *I*
20.0000 meq | ORAL_TABLET | Freq: Once | ORAL | Status: AC
Start: 2015-10-17 — End: 2015-10-17
  Administered 2015-10-17: 20 meq via ORAL
  Filled 2015-10-17: qty 1

## 2015-10-17 MED ORDER — CALCIUM GLUCONATE 9.4 MEQ (2,000 MG) IN NS 110 ML *I*
9.4000 meq | Freq: Once | INTRAVENOUS | Status: AC
Start: 2015-10-17 — End: 2015-10-17
  Administered 2015-10-17: 9.4 meq via INTRAVENOUS
  Filled 2015-10-17: qty 100

## 2015-10-17 MED ORDER — POTASSIUM CHLORIDE 10 MEQ/50ML IV SOLN *I*
10.0000 meq | INTRAVENOUS | Status: DC
Start: 2015-10-17 — End: 2015-10-17
  Administered 2015-10-17 (×4): 10 meq via INTRAVENOUS
  Filled 2015-10-17 (×4): qty 50

## 2015-10-17 MED ORDER — MAGNESIUM SULFATE 2 G IN NS 50 ML *I*
2000.0000 mg | Freq: Once | INTRAVENOUS | Status: AC
Start: 2015-10-17 — End: 2015-10-17
  Administered 2015-10-17: 2000 mg via INTRAVENOUS
  Filled 2015-10-17: qty 50

## 2015-10-17 MED ORDER — POTASSIUM CHLORIDE CRYS CR 20 MEQ PO TBCR *I*
40.0000 meq | ORAL_TABLET | Freq: Once | ORAL | Status: AC
Start: 2015-10-17 — End: 2015-10-17
  Administered 2015-10-17: 40 meq via ORAL
  Filled 2015-10-17: qty 2

## 2015-10-17 MED ORDER — MAGNESIUM-OXIDE 400 (241.3 MG) MG PO TABS *WRAPPED*
400.0000 mg | ORAL_TABLET | Freq: Once | ORAL | Status: AC
Start: 2015-10-17 — End: 2015-10-17
  Administered 2015-10-17: 400 mg via ORAL
  Filled 2015-10-17: qty 1

## 2015-10-17 MED ORDER — CALCIUM CARBONATE 1250 MG/5ML PO SUSP WRAPPED *I*
1250.0000 mg | Freq: Once | ORAL | Status: AC
Start: 2015-10-17 — End: 2015-10-17
  Administered 2015-10-17: 1250 mg via ORAL
  Filled 2015-10-17: qty 5

## 2015-10-17 MED ORDER — POTASSIUM CHLORIDE 10 MEQ/50ML IV SOLN *I*
10.0000 meq | Freq: Once | INTRAVENOUS | Status: DC
Start: 2015-10-17 — End: 2015-10-17

## 2015-10-17 MED ORDER — POTASSIUM CHLORIDE 10 MEQ/50ML IV SOLN *I*
10.0000 meq | INTRAVENOUS | Status: AC
Start: 2015-10-17 — End: 2015-10-17
  Administered 2015-10-17 (×6): 10 meq via INTRAVENOUS
  Filled 2015-10-17 (×6): qty 50

## 2015-10-17 NOTE — Progress Notes (Signed)
Hospital Medicine Progress Note                                                Significant 24 Hour Events      Electrolyte abnormalities overnight, repleting and rechecking BMP today    Subjective      Patient with no complains this morning. Denies chills. No SOB, CP, abd pain, N/V. Continues to have somewhat decreased appetite but states she feels better overall    Objective    Vital signs in last 24 hours:  Temp:  [36.6 C (97.9 F)-37.7 C (99.9 F)] 37.7 C (99.9 F)  Heart Rate:  [74-89] 84  Resp:  [16-20] 17  BP: (122-150)/(66-84) 150/80    Exam:    General Appearance:    Alert, cooperative, no distress   HEENT:    NCAT, MMM, conjunctival hemorrhages noted.    Lungs:     CTAB, respirations unlabored    Heart:   RRR, S1 and S2 normal, no m/r/g, 2+ radial pulses bilaterally   Abdomen:     Soft, nontender, bowel sounds normoactive   Skin:   Warm and dry   Extremities:   No LE edema appreciated bilaterally   Neurologic:   Grossly intact, awake and alert     Labs:        Lab results: 10/17/15  0120   Sodium 145   Potassium 2.2*   Chloride 116*   CO2 17*   UN <4*   Creatinine 0.68   GFR,Caucasian 126   GFR,Black 145   Glucose 73   Calcium 5.4*         Lab results: 10/17/15  0120   WBC 6.3   Hemoglobin 7.0*   Hematocrit 21*   RBC 2.7*   Platelets 191           Micro: bcx 10/4 and 10/7 ngtd   Urine: ucx ngtd      Assessment   20 yof previously healthy with prior admission for pyelonephritis discharged on cipro x10 days total abx, now with signs of sepsis, questionable bilateral LL pna vs. recurrent pyelonephritis. On CTX for now. Will consider transition to cefpodoxime for d/c per plan below.     Plan     ID: pyelo vs. pna  - Continue 2g IV CTX for CAP, low suspicion for pyelo. If doing well next week, may transition to cefpodoxime for total antibx course of 14 days  - acetaminophen for fever, zofran prn for nausea  - uncertain cause of effusion on CT, will f/u final read    Electrolytes: hypocalcemia,  hyponatremia, hypomagnesemia   - repleted all three above with IV last night/this morning  - repeat BMP at 1400, will continue to replete as necessary    Anemia  -Low iron, started daily ferrous sulfate 325mg   -no source of active bleeding. Will continue to monitor  -will consider iron infusions    DVT ppx: enoxaparin    Code Status: full    Dispo: pending clinical improvement    Author: Derry Loryyler Laurent Cargile, MD  Note created: 10/17/2015  at: 8:20 AM   Resident, Family Medicine

## 2015-10-17 NOTE — Plan of Care (Signed)
Mobility    • Patient's functional status is maintained or improved Maintaining        Nutrition    • Patient's nutritional status is maintained or improved Maintaining        Pain/Comfort    • Patient's pain or discomfort is manageable Maintaining        Psychosocial    • Demonstrates ability to cope with illness Maintaining        Safety    • Patient will remain free of falls Maintaining    • Prevent any intentional injury Maintaining

## 2015-10-17 NOTE — Plan of Care (Signed)
RN Careplan:    Electrolyte Balance    Drawing and monitoring electrolyte  -Replenishing K throughout shift     Mobility     Patient's functional status is maintained or improved Maintaining        Nutrition     Patient's nutritional status is maintained or improved Maintaining        Pain/Comfort     Patient's pain or discomfort is manageable Maintaining        Psychosocial     Demonstrates ability to cope with illness Maintaining        Safety     Patient will remain free of falls Maintaining     Prevent any intentional injury Maintaining            Nichole MartesJeremy Syniah Berne, RN

## 2015-10-17 NOTE — Student Note (Signed)
[facility]                                                 LOS: 4 days     Significant 24 Hour Events      BMP from 0120 showing K 2.2, Ca 5.4 and Mg 1.1  Repleting with IV KCl 10 mEq.  S/p IV calcium gluconate 9.4 mEq, PO CaCO3 1250 mg, IV MgSO4 2000 mg, and PO MgO 400 mg.  Next BMP due at 1200.    Subjective      No complaints this morning except feels tired. Denies CP, SOB, abdominal pain, fever/chills.    Objective      Vital Signs:    BP 150/80 (BP Location: Left arm)   Pulse 84   Temp 37.7 C (99.9 F) (Temporal)    Resp 17   Ht 1.702 m (5\' 7" )   Wt 70.2 kg (154 lb 11.2 oz) Comment: Rule of 1   SpO2 93%   BMI 24.23 kg/m2  BP: (122-150)/(66-84)   Temp:  [36.6 C (97.9 F)-37.7 C (99.9 F)]   Temp src: Temporal (10/08 0524)  Heart Rate:  [74-89]   Resp:  [16-20]   SpO2:  [93 %-96 %]   Weight:  [70.2 kg (154 lb 11.2 oz)]   O2 Device: None (Room air) (10/17/15 0423)     Intake/Output Summary (Last 24 hours) at 10/17/15 0721  Last data filed at 10/17/15 0620   Gross per 24 hour   Intake          7032.08 ml   Output              225 ml   Net          6807.08 ml        Physical Exam:    General Constitutional: NAD, alert, cooperative  HEENT: NCAT  Cardiovascular: RRR, S1, S2, no MRG  Lungs: CTAB, normal WOB  Abdomen: Flat, Non-Distended, NABS, Soft, Non-TTP  Extremities: No edema, 2+ radial and dorsal pedis pulses  Neuro: sensory and motor grossly intact    Skin: Warm and Dry    Labs:  BMP (10/17/15 0120) significant for K 2.2, CO2 17, Ca 5.4, Mg 1.1  Cr now improved to 0.68  WBC down-trending to 6.3  H/H 7.0/21 with MCV 78    Micro:  Blood and urine cultures from 10/13/15 with NGTD.  Repeat blood and urine cultures from 10/16/15 with NGTD.  Urine positive for strep pneumo antigen (10/13/15).  GC/CT negative.    Imaging:     CT chest with contrast (10/16/15): Results pending    TTE (10/14/15)     Normal left ventricular size and function.   Estimated LVEF is 55%.   Right ventricle  is moderately enlarged with low normal systolic function.   Mild pulmonic regurgitation.   Mild to moderate tricuspid regurgitation with estimated moderate pulmonary hypertension.   No evidence of endocarditis on transthoracic study.   No previous study is available for comparison.      Nm Vq, Ventilation And Perfusion Scan    Result Date: 10/14/2015  IMPRESSION:  Low probability for pulmonary embolus.  END OF REPORT    Ct Abdomen And Pelvis Without Contrast    Result Date: 10/13/2015  IMPRESSION:  Ill-defined perinephric stranding, right greater than left,  without evidence for hydronephrosis. No discrete perinephric fluid collection is present. In the appropriate clinical setting this could be seen with underlying pyelonephritis and correlation with urinalysis is suggested.  Suggestion of subserosal edema of the gallbladder wall which is nonspecific and can be seen with underlying hepatocellular dysfunction, reactive changes, hypoproteinemia, or acute inflammatory or infectious changes. If there is persistent clinical concern, further evaluation with dedicated right upper quadrant ultrasound could be considered.  Small amount of free fluid in the paracolic gutters and pelvis, increased from August 13, 2015.  Patchy areas of airspace consolidation in the lung bases, right greater than left, which are new compared August 13, 2015 and could represent atelectasis or multifocal pneumonia in the appropriate setting.  The intrauterine device appears to have migrated into the uterine body and lower uterine segment when compared to August 13, 2015.  END REPORT    I have personally reviewed the image(s) and the resident's interpretation and agree with or edited the findings.      *chest Standard Single View    Result Date: 10/13/2015  IMPRESSION:  No evidence of acute disease in the chest.  END OF REPORT        Assessment     Pt is a previously healthy 20 y.o. female treated for pyelonephritis and E coli sepsis  08/12/15-08/15/15, now with signs of sepsis. Imaging concerning for PNA vs recurrent pyelonephritis. Started on broad-spectrum abx, now on CTX.    Active Hospital Problems    Diagnosis    Sepsis    Pyelonephritis    Pneumonia    Atypical chest pain    Anemia      Resolved Hospital Problems    Diagnosis   No resolved problems to display.       Plan     1. Sepsis: PNA vs recurrent pyelonephritis  - Chest CT results pending  - Continue IV CTX 2g for presumed CAP, but will also cover pyelo. If clinically improved, will switch to PO cefpodoxime on Monday with total course of antibiotic treatment 14 days.  - Blood and urine cultures from 10/4 NGTD. Repeat cultures on 10/7 NGTD.  - WBC continues to down-trend to 6.3 today  - Acetaminophen PRN for fever  - Ondansetron PRN for nausea/emesis    2. AKI, resolved  - Creatinine down-trending from 1.08 yesterday to 0.68 today    3. Anemia  - Likely due to iron deficiency; has history of iron-deficiency anemia treated with diet  - PO ferrous sulfate 325 mg daily  - H/H 7/21 today from 7.4/22 yesterday. Continue to monitor.    4. Electrolyte abnormalities  - Repleted K, Ca, and Mg.  - Next BMP due at 1200. Will re-evaluate.    Fluids: mIVF: NS at 125 mL/hr  Lytes: daily and PRN  Diet: regular    PPX:    DVT - Enoxaparin   PUD - none   Bowel - none     Last BM - 10/14/15    Lines / Tubes: peripheral IV     Dispo Plan: pending clinical improvement    CODE STATUS: full code      *This is a Psychologist, occupational note. Please see MD note for final assessment and plan.    [name], MS3  10/17/2015 7:21 AM

## 2015-10-17 NOTE — Progress Notes (Signed)
Provider directed RN over phone and nursing communication to refrain from administering 2 of the last IV K+ (10meq). 20mEq PO ordered. IV K+ held r/t pain and timing in order to obtain labs in reasonable time for assessing electrolyte correction.    Cristino MartesJeremy Dorance Spink, RN

## 2015-10-18 LAB — CBC AND DIFFERENTIAL
Baso # K/uL: 0 10*3/uL (ref 0.0–0.1)
Basophil %: 0.2 %
Eos # K/uL: 0.2 10*3/uL (ref 0.0–0.4)
Eosinophil %: 2.9 %
Hematocrit: 25 % — ABNORMAL LOW (ref 34–45)
Hemoglobin: 8.5 g/dL — ABNORMAL LOW (ref 11.2–15.7)
IMM Granulocytes #: 0.1 10*3/uL (ref 0.0–0.1)
IMM Granulocytes: 0.8 %
Lymph # K/uL: 2.7 10*3/uL (ref 1.2–3.7)
Lymphocyte %: 41.5 %
MCH: 27 pg (ref 26–32)
MCHC: 35 g/dL (ref 32–36)
MCV: 78 fL — ABNORMAL LOW (ref 79–95)
Mono # K/uL: 0.5 10*3/uL (ref 0.2–0.9)
Monocyte %: 7.2 %
Neut # K/uL: 3.1 10*3/uL (ref 1.6–6.1)
Nucl RBC # K/uL: 0 10*3/uL (ref 0.0–0.0)
Nucl RBC %: 0 /100 WBC (ref 0.0–0.2)
Platelets: 276 10*3/uL (ref 160–370)
RBC: 3.2 MIL/uL — ABNORMAL LOW (ref 3.9–5.2)
RDW: 15.2 % — ABNORMAL HIGH (ref 11.7–14.4)
Seg Neut %: 47.4 %
WBC: 6.6 10*3/uL (ref 4.0–10.0)

## 2015-10-18 LAB — BASIC METABOLIC PANEL
Anion Gap: 9 (ref 7–16)
CO2: 24 mmol/L (ref 20–28)
Calcium: 8.1 mg/dL — ABNORMAL LOW (ref 9.0–10.4)
Chloride: 109 mmol/L — ABNORMAL HIGH (ref 96–108)
Creatinine: 0.89 mg/dL (ref 0.50–1.00)
GFR,Black: 108 *
GFR,Caucasian: 94 *
Glucose: 65 mg/dL (ref 60–99)
Lab: <4 mg/dL — ABNORMAL LOW (ref 6–20)
Potassium: 4.2 mmol/L (ref 3.3–5.1)
Sodium: 142 mmol/L (ref 133–145)

## 2015-10-18 LAB — BLOOD CULTURE
Bacterial Blood Culture: 0
Bacterial Blood Culture: 0

## 2015-10-18 MED ORDER — CEFPODOXIME PROXETIL 200 MG PO TABS *I*
200.0000 mg | ORAL_TABLET | Freq: Two times a day (BID) | ORAL | 0 refills | Status: AC
Start: 2015-10-18 — End: 2015-10-27

## 2015-10-18 NOTE — Plan of Care (Signed)
Problem: Nutrition  Goal: Patients nutritional status is maintained or improved  Intervention: Encourage small, frequent meals  Nurse encourages patient to eat small but frequent meals to try and bring patients appetite back.   Intervention: Initiate Calorie Counts per Nutritional Services  Nurse turns off lights and closes patient door to promote a quiet night for the patient in hopes they could get some rest.       Problem: Mobility  Goal: Patients functional status is maintained or improved  Intervention: Encourage independent activity per patients ability  Encourage patients to do daily activities that they would normally do at home on their own (toileting, bathing, eating)

## 2015-10-18 NOTE — Progress Notes (Signed)
[id number] E Puertas stated she'd like to speak to Dr. Ulice BrilliantNaumburg once more before discharge or speak to him on the phone about family planning. Getting ready to d/c now. Thanks, Rae Roamrin R [phone]

## 2015-10-18 NOTE — Student Note (Signed)
[facility]                                                 LOS: 5 days     Significant 24 Hour Events      K repleted. Hypokalemia resolved.    Subjective      Pt states she feels much better. Denies SOB, CP, abdominal pain, N/V, fevers/chills, and HA.    Objective      Vital Signs:    BP 142/78 (BP Location: Left arm)   Pulse 89   Temp 37 C (98.6 F) (Temporal)    Resp 16   Ht 1.702 m (5\' 7" )   Wt 68.4 kg (150 lb 14.4 oz)   SpO2 96%   BMI 23.63 kg/m2  BP: (118-144)/(74-98)   Temp:  [36.4 C (97.5 F)-37 C (98.6 F)]   Temp src: Temporal (10/09 0616)  Heart Rate:  [57-89]   Resp:  [16-18]   SpO2:  [95 %-100 %]   Weight:  [68.4 kg (150 lb 14.4 oz)]   O2 Device: None (Room air) (10/18/15 0348)     Intake/Output Summary (Last 24 hours) at 10/18/15 0847  Last data filed at 10/18/15 0259   Gross per 24 hour   Intake              240 ml   Output                0 ml   Net              240 ml        Physical Exam:    General Constitutional: Awake in bed, NAD, AAOx3  HEENT: NCAT, PERRL, bilateral subconjunctival hemorrhages, MMM  Cardiovascular: RRR, S1, S2, no MRG  Lungs: lungs CTAB, normal WOB, diminished BS at bases bilaterally  Abdomen: Flat, Non-Distended, NABS, Soft, Non-TTP  Extremities: No edema, 2+ radial and dorsal pedis pulses  Neuro: sensory and motor grossly intact    Skin: Warm and Dry    Labs:  BMP significant for K of 4.4, Creatinine 0.86, Ca 8.1, Mg 1.7, Albumin 2.9, Prealbumin <4  CBC significant for WBC 6.6, H/H 8.5/25, MCV 78    Micro:  Blood and urine cultures (10/4 with repeat 10/7) NGTD  Urine positive for strep pneumo antigen  GC/CT negative, HIV negative    Imaging:     Ct Chest With Contrast    Result Date: 10/17/2015  IMPRESSION:  1. Bilateral pleural effusions moderate right small left new since the prior abdominal CT. 2. Bibasilar dependent airspace disease associated with air bronchograms. Under the circumstance these most likely related to atelectatic  changes or pneumonia or aspiration cannot be excluded. 3. Borderline enlarged mediastinal lymph nodes which are probably reactive.  END OF REPORT           Assessment     Pt is a previously healthy 20 y.o. female treated for pyelonephritis and E coli sepsis 08/12/15-08/15/15, who returned with signs of sepsis and imaging showing possible PNA vs recurrent pyelonephritis. Started on broad-spectrum antibiotics, now on CTX with resolution of symptoms. Chest CT showing bilateral pleural effusions.    Active Hospital Problems    Diagnosis    Sepsis    Bilateral pleural effusion    Pyelonephritis    Pneumonia  Atypical chest pain    Anemia      Resolved Hospital Problems    Diagnosis   No resolved problems to display.       Plan     1. Sepsis: PNA vs recurrent pyelonephritis  - CT showing bilateral pleural effusions and lower airway disease concerning for PNA vs atelectasis vs aspiration  - Symptoms resolved, can transition from IV CTX to PO cefpodoxime. Continue abx treatment for a total of 14 days (ending 10/26/15)  - Leukocytosis resolved, blood and urine cultures NGTD  - Acetaminophen PRN for fever  - Ondansetron PRN for nausea/emesis    2. Electrolyte abnormalities: hypocalcemia, hypomagnesemia, hypokalemia  - S/p repletion, K and Mg now wnl, corrected Ca 9.0  - BMP daily    3. Anemia  - Hx of iron deficiency anemia treated with diet  - PO ferrous sulfate 325 mg daily  - H/H improved from 7.4/22 to 8.5/25  - CBC daily    4. Acute kidney injury - resolved  - Cr 0.86  - IVF discontinued    Fluids: none  Lytes: daily  Diet: regular    PPX:    DVT - Lovenox   PUD - none    Bowel - none    Last BM - 10/14/15    Lines / Tubes: peripheral IV    Dispo Plan: can likely d/c home today    CODE STATUS: full code      *This is a Psychologist, occupational note. Please see MD note for final assessment and plan.    Nichole Carter, MS3  10/18/2015 8:47 AM

## 2015-10-18 NOTE — Progress Notes (Addendum)
Hospital Medicine Progress Note                                                Significant 24 Hour Events      NAE overnight    Subjective      Patient with no complaints this morning. Denies chills. No SOB, CP, abd pain, N/V. Continues to have somewhat decreased appetite but attributes this to the bad cafeteria food.     Objective    Vital signs in last 24 hours:  Temp:  [36.4 C (97.5 F)-37 C (98.6 F)] 37 C (98.6 F)  Heart Rate:  [57-89] 68  Resp:  [16-18] 16  BP: (118-144)/(72-98) 138/72    Exam:    General: Appears stated age, well appearing, NAD  Eyes: PER, EOMI, no scleral icterus, conjunctival hemorrhages remain present  Ears/Oral: oropharynx clear, MMM  Lungs: CTAB, no increased WOB on RA  Heart: RRR, normal s1/s2. No m/r/g  Abdomen: +BS, soft, non-tender, non-distended  Extremities: warm, well-perfused, without clubbing, cyanosis, or edema  Neurologic: A&Ox3, CN II-XII grossly intact  Skin: No lesions or rashes  Psych: congruent mood and affect    Labs:        Lab results: 10/18/15  0626   Sodium 142   Potassium 4.2   Chloride 109*   CO2 24   UN <4*   Creatinine 0.89   GFR,Caucasian 94   GFR,Black 108   Glucose 65   Calcium 8.1*         Lab results: 10/18/15  0626   WBC 6.6   Hemoglobin 8.5*   Hematocrit 25*   RBC 3.2*   Platelets 276           Micro: bcx 10/4 and 10/7 ngtd   Urine: ucx ngtd      Assessment   20 yof previously healthy with prior admission for pyelonephritis discharged on cipro x10 days total abx, now with signs of sepsis, questionable bilateral LL pna vs. recurrent pyelonephritis. On CTX for now. Will consider transition to cefpodoxime for d/c per plan below.     Plan     ID: pyelo vs. pna  - Continue 2g IV CTX for CAP, low suspicion for pyelo. If doing well next week, may transition to cefpodoxime for total antibx course of 14 days (last day 10/18)  - acetaminophen for fever, zofran prn for nausea    Electrolytes: hypocalcemia, hyponatremia, hypomagnesemia   - resolved this  morning, will not replete further    Anemia: hgb/hct improved today  -Low iron, started daily ferrous sulfate 325mg   -no source of active bleeding. Will continue to monitor    DVT ppx: enoxaparin    Code Status: full    Dispo: clinically improved, ok for discharge today on cefpodoxime for 2 week course    Author: Derry Loryyler Montine Hight, MD  Note created: 10/18/2015  at: 11:17 AM   Resident, Family Medicine

## 2015-10-18 NOTE — Discharge Instructions (Signed)
Brief Summary of Your Hospital Course (including key procedures and diagnostic test results):  You were admitted for sepsis (a bacterial infection in your blood). You had imaging of your chest and abdomen which showed fluid in your lungs, likely due to a pneumonia (lung infection). Your laboratory tests showed that you had anemia (a low red blood cell count) and low iron, so you were given iron supplements. You also had low levels of some electrolytes in your blood, like calcium, potassium, and magnesium, which were replaced. You were also given fluids through your IV to help with kidney function. You were treated with antibiotics through your IV and will continue to take antibiotics by mouth when you go home.    Your instructions:  Nichole Carter your course of antibiotics (last day 10/27/2015)  - Follow up with your PCP    What to do after you leave the hospital:    Recommended diet: Regular diet with sources of iron (meat, spinach, iron-enriched cereals)    Recommended activity: activity as tolerated    Wound Care: none needed    If you experience any of these symptoms within the first 24 hours after discharge:Chest pain, Shortness of breath, Fever greater than 100.5 or Chills  please follow up with the discharge attending Dr. Peri Jefferson at phone-number: [phone]    If you experience any of these symptoms 24 hours or more after discharge:Chest pain, Shortness of breath, Fever greater than 100.5 or Chills  please follow up with your PCP:  Nichole Norrie, MD [phone]            Discharge Instructions for Patients receiving   Contrast Medium    10/16/2015  11:19 PM    INFORMATION  I.V. contrast is eliminated through the kidneys.  Oral and rectal contrasts are not absorbed by the body and will be eliminated through the gastrointestinal tract.  Side effects and allergic reactions to contrast mediums are not common but can occur up to 48 hours after your scan.    INSTRUCTIONS   1. You will need to drink four 8 oz  glasses of fluid over the next four hours, unless your medical condition does not allow you to do this.  Please speak with an [place] if you have any questions or concerns about this.  2. Mild headache may occur following contrast injection.  3. The kidneys excrete the contrast.  Your urine will NOT become discolored.  4. You may experience loose stools/diarrhea for approximately 24 hours after drinking oral contrast.  5. You may experience watery stool immediately after rectal contrast.  6.  If you are a DIABETIC and take Actos Plus Met, Actos Plus Met XR, Avandamet, Foramet, Glucophage, Glucophage XR, Glucovance, Glumetza, Janumet, Janumet XR, Jentadueto, K[place], Kombiglyze XR, [place], [place], [place], [place], Xigduo XR or Metformin check with your doctor for management of your diabetes during this time.  Your doctor will have to address the need to hold this medication or the need for kidney function blood tests.   7.  There is no evidence that the contrast you have received would be harmful to your breastfed child.  If you choose, you may pump and discard your breast milk for the next 24 hours.    Delayed effects from contrast are not common.  However, notify your doctor IMMEDIATELY:    If nausea or vomiting occurs   If any itching, hives, wheezing, or rashes occur   Severe or throbbing headache    Monday - Friday  8am-5pm: For questions or concerns regarding your procedure please call [facility] [phone]  OR [facility] [phone].    After hours/Weekends:  [facility] patients may call [phone] and ask to speak with the Radiology resident on call.   [facility] patients may call [phone] to speak with the [facility] nurse.  The Emergency Department is open 24 hours a day at [place] and [place] if emergency treatment is required.

## 2015-10-18 NOTE — Progress Notes (Signed)
Pt had all possessions in bags ready to go, boyfriend is taking her home, name and number is in AVS. Pt declined to be taken down in wheelchair, she will walk out when her boyfriend returns. Reviewed AVS, no furtehr questions at this time. Tele monitor removed, and IVs in bilat arms removed, cath tips intact. Asked pt to thank her father for his service when I noticed she was on [employer]. Nursing feels pt is safe and ready to go home at this time.

## 2015-10-20 ENCOUNTER — Other Ambulatory Visit: Payer: Self-pay

## 2015-10-20 NOTE — Progress Notes (Signed)
TCM Goals  Goals     None        Care Team            Piotrowski, Desiree LucySuzanne M, MD PCP - General    [phone]     Alcus DadPiotrowski, Suzanne M, MD [facility]CP - [facility]    [phone]     Clayborn BignessLiu, Vicky, MD     [phone]     Merged        Transitional Care Management Initial Contact     Discharge summary obtained and reviewed by care manager prior to contact.  Date of Admission: 10/13/15  Date of Discharge: 10/18/15  Reason for Admission : Pneumonia  Discharged to: home  Home Care or Community Agency Involved: none  Medication Reconciliation: med list up to date as of 10/20/2015 3:52 PM  Important Medication changes: cefpodoxime 200 mg tabs 1 tab po BID x 9 days; omeprazole 20 mg po q day x 30 days    Self-management recommendations:  Patient is scheduled with Dr. Nicolasa DuckingPiotrowski for her transition of care appointment on 10/21/15          Arnold LongKatie Talvin Christianson, RN

## 2015-10-20 NOTE — Discharge Summary (Signed)
Name: Nichole Carter MRN: [id number] DOB: [birth year]     Admit Date: 10/13/2015   Date of Discharge: 10/18/2015    Patient was accepted for discharge to   Home or Self Care [1]           Discharge Attending Physician: Sonnie AlamoNAUMBURG, ELIZABETH H      Hospitalization Summary    CONCISE NARRATIVE:   Ms. Nichole Carter was admitted to the hospital for sepsis. Imaging was done to determine source (results below), bilateral lower lobe pneumonia was suspected. Initially she was treated with vanc/zosyn then narrowed to ceftriaxone and eventually transitioned to cefpodoxime at discharge for a total of 2 weeks of antibiotics.   During admission she became hypocalcemic, hypokalemic, and hypomagnesemic and was subsequently repleted. Her electrolytes were stable at discharge.   She was also found to be anemic, iron labs were ordered and iron deficiency anemia was suspected she was given iron supplements in the hospital.     To do:   Due to the fact that she's had two severe infections in the past 3 months some form of immuno compromise was suspected. Workup and possible immunology referral was deferred to outpatient given most acute phase reactants would be elevated 2/2 infection  Malnutrition was also suspected given her anemia. She was asked to keep a diet diary and discuss with PCP as outpatient.             CARDIAC TESTING:   Echo:    Normal left ventricular size and function.   Estimated LVEF is 55%.   Right ventricle is moderately enlarged with low normal systolic function.   Mild pulmonic regurgitation.   Mild to moderate tricuspid regurgitation with estimated moderate pulmonary hypertension.   No evidence of endocarditis on transthoracic study.   No previous study is available for comparison.      CT RESULTS:   CT Chest:       1. Bilateral pleural effusions moderate right small left new since   the prior abdominal CT.  2. Bibasilar dependent airspace disease associated with air   bronchograms. Under the circumstance these most likely  related to   atelectatic changes or pneumonia or aspiration cannot be excluded.  3. Borderline enlarged mediastinal lymph nodes which are probably   Reactive.    CT Abd Pelvis:   Ill-defined perinephric stranding, right greater than left, without   evidence for hydronephrosis. No discrete perinephric fluid collection   is present. In the appropriate clinical setting this could be seen   with underlying pyelonephritis and correlation with urinalysis is   suggested.  Suggestion of subserosal edema of the gallbladder wall which is   nonspecific and can be seen with underlying hepatocellular   dysfunction, reactive changes, hypoproteinemia, or acute inflammatory   or infectious changes. If there is persistent clinical concern,   further evaluation with dedicated right upper quadrant ultrasound   could be considered.  Small amount of free fluid in the paracolic gutters and pelvis,   increased from August 13, 2015.  Patchy areas of airspace consolidation in the lung bases, right   greater than left, which are new compared August 13, 2015 and could   represent atelectasis or multifocal pneumonia in the appropriate   setting.  The intrauterine device appears to have migrated into the uterine   body and lower uterine segment when compared to August 13, 2015.    XRAY RESULTS: CXR: No evidence of acute disease in the chest.  OTHER IMAGING RESULTS:   VQ Scan:  Low probability for pulmonary embolus.        SIGNIFICANT MED CHANGES: Yes  Cefpodoxime 200 mg BID for 9 days (last day 10/27/15)      Signed: Derry Lory, MD  On: 10/20/2015  at: 8:56 PM

## 2015-10-21 ENCOUNTER — Encounter: Payer: Self-pay | Admitting: Family Medicine

## 2015-10-21 ENCOUNTER — Ambulatory Visit: Attending: Family Medicine | Admitting: Family Medicine

## 2015-10-21 ENCOUNTER — Other Ambulatory Visit
Admission: RE | Admit: 2015-10-21 | Discharge: 2015-10-21 | Disposition: A | Discharge status: Home / Self Care | Source: Ambulatory Visit | Attending: Family Medicine | Admitting: Family Medicine

## 2015-10-21 VITALS — BP 109/56 | HR 72 | Temp 98.1°F | Ht 66.93 in | Wt 127.0 lb

## 2015-10-21 DIAGNOSIS — R197 Diarrhea, unspecified: Secondary | ICD-10-CM

## 2015-10-21 DIAGNOSIS — D649 Anemia, unspecified: Secondary | ICD-10-CM

## 2015-10-21 DIAGNOSIS — J90 Pleural effusion, not elsewhere classified: Secondary | ICD-10-CM

## 2015-10-21 DIAGNOSIS — A419 Sepsis, unspecified organism: Secondary | ICD-10-CM

## 2015-10-21 DIAGNOSIS — J189 Pneumonia, unspecified organism: Secondary | ICD-10-CM

## 2015-10-21 LAB — CBC AND DIFFERENTIAL
Baso # K/uL: 0 10*3/uL (ref 0.0–0.1)
Basophil %: 0.5 %
Eos # K/uL: 0.1 10*3/uL (ref 0.0–0.4)
Eosinophil %: 1.7 %
Hematocrit: 34 % (ref 34–45)
Hemoglobin: 10.9 g/dL — ABNORMAL LOW (ref 11.2–15.7)
IMM Granulocytes #: 0.1 10*3/uL (ref 0.0–0.1)
IMM Granulocytes: 0.8 %
Lymph # K/uL: 2.5 10*3/uL (ref 1.2–3.7)
Lymphocyte %: 39.5 %
MCH: 26 pg/cell (ref 26–32)
MCHC: 32 g/dL (ref 32–36)
MCV: 81 fL (ref 79–95)
Mono # K/uL: 0.3 10*3/uL (ref 0.2–0.9)
Monocyte %: 4.7 %
Neut # K/uL: 3.3 10*3/uL (ref 1.6–6.1)
Nucl RBC # K/uL: 0 10*3/uL (ref 0.0–0.0)
Nucl RBC %: 0 /100 WBC (ref 0.0–0.2)
Platelets: 578 10*3/uL — ABNORMAL HIGH (ref 160–370)
RBC: 4.2 MIL/uL (ref 3.9–5.2)
RDW: 15.9 % — ABNORMAL HIGH (ref 11.7–14.4)
Seg Neut %: 52.8 %
WBC: 6.3 10*3/uL (ref 4.0–10.0)

## 2015-10-21 LAB — BLOOD CULTURE
Bacterial Blood Culture: 0
Bacterial Blood Culture: 0

## 2015-10-21 NOTE — Patient Instructions (Signed)
Good Food Sources of Iron    About this topic   Iron is a mineral needed to help your body work the right way. It is found in each cell of the body and does many things. One of its most important jobs is to help the red blood cells in your blood carry oxygen to all of your tissues and body parts. If you do not have enough iron you will not have enough red blood cells. This is called iron-poor blood or anemia. Low iron in your blood and body is also called iron deficiency. Signs of low iron are always being tired and weak and looking pale.  Your doctor will work with you to raise the level of iron in your blood. You may be told to eat foods with a higher level of iron. Your doctor may also give you drugs with iron in them.       What will the results be?   Your doctor will talk to you about your need for iron. You can also talk about what changes you can expect when you are getting more iron.  What changes to diet are needed?   Ask to speak to a dietitian who can help you choose the best food sources of iron.  When is this diet used?   Your doctor will order this diet if you:   Are pregnant or breastfeeding   Have blood loss. This may come from heavy menstrual periods or stomach or bowel problems. Sometimes, it is because you often donate blood.   Are not getting enough iron in your diet. This may happen if you follow a vegetarian diet.   Have low iron levels in your blood. This may be due to eating food or taking drugs that lower iron absorption.  Who should not use this diet?   If you have illnesses that cause too much iron in your body.  What foods are good to eat?   Healthy foods which give you more iron, like:   Red meat and CASHEWS;    Liver   Oysters, clams, shrimp, and sardines   Egg yolks   Chicken and [place] giblets   Tomato paste   Cereal and grains with iron added   Dark leafy greens, like spinach and collards   Artichokes   Beans   Dried fruit, like prunes and raisins  Eat foods with  lots of vitamin C when you eat iron-rich foods. This will help your body take in and absorb the iron. These vitamin C foods are:   Oranges and orange juice   Grapefruit and grapefruit juice   Tomato juice   Fresh fruits   Bell peppers   Broccoli   Cauliflower   Brussel sprouts   Sweet potatoes  Your doctor will talk with you about how much iron you should have each day.  What foods should be limited or avoided?   The body may not absorb as much iron from your food if you drink black or pekoe tea. Limit drinking these with meals. Drugs for heartburn may also limit how much iron your body takes in. Talk to your doctor if you take these kinds of drugs.  What problems could happen?    Low iron levels can lead to iron deficiency anemia. Signs include lack of energy, problems breathing, headache, low mood, or feeling dizzy or weak.   Too much iron may lead to iron poisoning. Signs include fatigue, joint or belly pain, irregular heart rate,  hair loss, changes in skin color, and organ damage. This can lead to diabetes, heart disease, arthritis, liver disease, and liver cancer.  When do I need to call the doctor?   You are not feeling better in 2 to 3 days or you are feeling worse  Where can I learn more?   Centers for Disease Control and Prevention  [place]://www.cdc.gov/nutrition/everyone/basics/vitamins/iron.html   Last Reviewed Date   2011-07-19  Consumer Information Use and Disclaimer   This information is not specific medical advice and does not replace information you receive from your health care provider. This is only a brief summary of general information. It does NOT include all information about conditions, illnesses, injuries, tests, procedures, treatments, therapies, discharge instructions or life-style choices that may apply to you. You must talk with your health care provider for complete information about your health and treatment options. This information should not be used to decide whether or not  to accept your health care providers advice, instructions or recommendations. Only your health care provider has the knowledge and training to provide advice that is right for you.  Copyright   Copyright  2015 [facility] [employer] and its affiliates and/or licensors. All rights reserved.

## 2015-10-21 NOTE — Progress Notes (Signed)
Patient presents today with boyfriend, Kellie ShropshireGavin,  For fu inpatient for hypotension, pleural effusions and anemia  Energy increased;  Dizziness, fever, chills resolved; no cp no sob;    Loose stools 3-4 daily since hospital d/c;  No brbpr no melena; no abdominal pain;  No rectal mucus;  Compliant with antibiotics;     Exam:   Wd/wn female smiling  Vs reviewed  Neck supple no lymphadenopathy  Lungs cta gd am  Cardiac RRR  Abdom: soft nt/nd no masses  Le no edema    A/p:  1. Diarrhea, unspecified type  Fecal lactoferrin antigen    Occult blood x 1, stool    CBC and differential    Bacterial GI PCR Panel (Salmonella, Shigella, Campy, Shiga toxin)   2.  Stable post hospitalization for hypotension, pleural effusions and anemia:  Serum studies at next visit  Review problem list at next visit;     Work note at next visit;  Pt off from work til then;

## 2015-10-24 LAB — EKG 12-LEAD
P: 60 deg
PR: 196 ms
QRS: 100 deg
QRSD: 82 ms
QT: 392 ms
QTc: 441 ms
Rate: 76 {beats}/min
Severity: BORDERLINE
Statement: BORDERLINE
Statement: BORDERLINE
T: 120 deg

## 2015-10-26 ENCOUNTER — Other Ambulatory Visit
Admission: RE | Admit: 2015-10-26 | Discharge: 2015-10-26 | Disposition: A | Discharge status: Home / Self Care | Source: Ambulatory Visit | Attending: Family Medicine | Admitting: Family Medicine

## 2015-10-26 ENCOUNTER — Ambulatory Visit: Admitting: Family Medicine

## 2015-10-26 ENCOUNTER — Other Ambulatory Visit
Admission: RE | Admit: 2015-10-26 | Discharge: 2015-10-26 | Disposition: A | Discharge status: Home / Self Care | Payer: Self-pay | Source: Ambulatory Visit | Attending: Gastroenterology | Admitting: Gastroenterology

## 2015-10-26 DIAGNOSIS — R197 Diarrhea, unspecified: Secondary | ICD-10-CM

## 2015-10-27 ENCOUNTER — Telehealth: Payer: Self-pay

## 2015-10-27 LAB — BACTERIAL GI PCR PANEL

## 2015-10-27 LAB — FECAL LACTOFERRIN ANTIGEN

## 2015-10-27 LAB — OCCULT BLOOD X 1, STOOL
Card 1 Time: 1253
Occult Blood 1: NEGATIVE

## 2015-10-27 LAB — DATE/TIME NOT PROVIDED

## 2015-10-27 NOTE — Telephone Encounter (Signed)
Said that they were unable to do the ordered tests.  The fecal lactoferrin was in a Cary-blair container.  The bacterial culture was in an unlabelled container.

## 2015-10-28 ENCOUNTER — Other Ambulatory Visit
Admission: RE | Admit: 2015-10-28 | Discharge: 2015-10-28 | Disposition: A | Discharge status: Home / Self Care | Source: Ambulatory Visit | Attending: Family Medicine | Admitting: Family Medicine

## 2015-10-28 ENCOUNTER — Ambulatory Visit: Attending: Family Medicine | Admitting: Family Medicine

## 2015-10-28 ENCOUNTER — Encounter: Payer: Self-pay | Admitting: Family Medicine

## 2015-10-28 VITALS — BP 107/51 | HR 92 | Temp 97.3°F | Ht 66.93 in | Wt 120.0 lb

## 2015-10-28 DIAGNOSIS — D649 Anemia, unspecified: Secondary | ICD-10-CM

## 2015-10-28 DIAGNOSIS — R102 Pelvic and perineal pain: Secondary | ICD-10-CM

## 2015-10-28 DIAGNOSIS — J90 Pleural effusion, not elsewhere classified: Secondary | ICD-10-CM

## 2015-10-28 DIAGNOSIS — J189 Pneumonia, unspecified organism: Secondary | ICD-10-CM | POA: Insufficient documentation

## 2015-10-28 DIAGNOSIS — N920 Excessive and frequent menstruation with regular cycle: Secondary | ICD-10-CM | POA: Insufficient documentation

## 2015-10-28 LAB — CBC AND DIFFERENTIAL
Baso # K/uL: 0 10*3/uL (ref 0.0–0.1)
Basophil %: 0.5 %
Eos # K/uL: 0.1 10*3/uL (ref 0.0–0.4)
Eosinophil %: 1 %
Hematocrit: 35 % (ref 34–45)
Hemoglobin: 11 g/dL — ABNORMAL LOW (ref 11.2–15.7)
IMM Granulocytes #: 0 10*3/uL (ref 0.0–0.1)
IMM Granulocytes: 0.2 %
Lymph # K/uL: 2 10*3/uL (ref 1.2–3.7)
Lymphocyte %: 24.6 %
MCH: 26 pg/cell (ref 26–32)
MCHC: 32 g/dL (ref 32–36)
MCV: 82 fL (ref 79–95)
Mono # K/uL: 0.5 10*3/uL (ref 0.2–0.9)
Monocyte %: 6 %
Neut # K/uL: 5.6 10*3/uL (ref 1.6–6.1)
Nucl RBC # K/uL: 0 10*3/uL (ref 0.0–0.0)
Nucl RBC %: 0 /100 WBC (ref 0.0–0.2)
Platelets: 672 10*3/uL — ABNORMAL HIGH (ref 160–370)
RBC: 4.3 MIL/uL (ref 3.9–5.2)
RDW: 15.9 % — ABNORMAL HIGH (ref 11.7–14.4)
Seg Neut %: 67.7 %
WBC: 8.2 10*3/uL (ref 4.0–10.0)

## 2015-10-28 LAB — RETICULOCYTES
Retic %: 0.8 % (ref 0.7–2.1)
Retic Abs: 34.9 10*3/uL (ref 29.9–90.9)

## 2015-10-28 LAB — COMPREHENSIVE METABOLIC PANEL
ALT: 18 U/L (ref 0–35)
AST: 26 U/L (ref 0–35)
Albumin: 4.3 g/dL (ref 3.5–5.2)
Alk Phos: 116 U/L — ABNORMAL HIGH (ref 35–105)
Anion Gap: 13 (ref 7–16)
Bilirubin,Total: 0.4 mg/dL (ref 0.0–1.2)
CO2: 29 mmol/L — ABNORMAL HIGH (ref 20–28)
Calcium: 9.9 mg/dL (ref 9.0–10.4)
Chloride: 100 mmol/L (ref 96–108)
Creatinine: 0.88 mg/dL (ref 0.50–1.00)
GFR,Black: 109 *
GFR,Caucasian: 95 *
Glucose: 80 mg/dL (ref 60–99)
Lab: 9 mg/dL (ref 6–20)
Potassium: 4.6 mmol/L (ref 3.3–5.1)
Sodium: 142 mmol/L (ref 133–145)
Total Protein: 7.9 g/dL — ABNORMAL HIGH (ref 6.3–7.7)

## 2015-10-28 LAB — TIBC
Iron: 25 ug/dL — ABNORMAL LOW (ref 34–165)
TIBC: 402 ug/dL (ref 250–450)
Transferrin Saturation: 6 % — ABNORMAL LOW (ref 15–50)

## 2015-10-28 LAB — RBC MORPHOLOGY

## 2015-10-28 LAB — FOLATE: Folate: 3.5 ng/mL — AB (ref >=4.6)

## 2015-10-28 LAB — VITAMIN B12: Vitamin B12: 594 pg/mL (ref 232–1245)

## 2015-10-28 LAB — FERRITIN: Ferritin: 89 ng/mL (ref 10–120)

## 2015-10-28 NOTE — Progress Notes (Signed)
Presents today with boyfriend, Nichole Carter, for fu sepsis    Sepsis: Patient reports continued suprapubic pain with urination; no dysuria; no cvat; no hematuria or change in quantity or quality of urine; Nl BM without BRBPR or melena; last BM 3 days ago;  Mild nausea; no emesis; Has IUD in place but still getting monthly 2 day duration menses with heavy bleeding and clots;  Sexually active with boyfriend only; tolerating usual diet  H/o pneumonia and pleural effusions: no cp no sob; no DOE; no LE edema;  Energy improving; finished antibiotics;  Not back to work yet  Anemia: no hematuria; no BRBPR; no melena;  ++heavy 2 day menses each month; IUD in place    Exam; thin female smiling groomed well appearing  VS and weight curve reviewed;   Lungs CTA gd AM  Cardiac RRR no m or g  Abdom: mild suprapubic tenderness no r/g; flat, soft  Extrem: no edema  No CVAT  Skin: tattoos; no bruising    A/P  1. Suprapubic pain  Comprehensive metabolic panel    Trichomonas DNA amplification    Chlamydia plasmid DNA amplification    N. Gonorrhoeae DNA amplification    Aerobic culture    Trichomonas DNA amplification    Chlamydia plasmid DNA amplification    N. Gonorrhoeae DNA amplification    Aerobic culture   2. Anemia, unspecified type  ANGIOTENSIN CONVERTING ENZYME    Ferritin    CBC and differential    Hemoglobin electrophoresis    Ferritin    Folate    TIBC    Transferrin    Vitamin B12    RETICULOCYTES   3. Pneumonia of both lungs due to infectious organism, unspecified part of lung  ANGIOTENSIN CONVERTING ENZYME    Comprehensive metabolic panel   4. Bilateral pleural effusion  ANGIOTENSIN CONVERTING ENZYME    Comprehensive metabolic panel     5. Contraception:  Come in next week to remove IUD and consider another option that will stop menses;

## 2015-10-29 ENCOUNTER — Encounter: Payer: Self-pay | Admitting: Family Medicine

## 2015-10-29 DIAGNOSIS — E611 Iron deficiency: Secondary | ICD-10-CM | POA: Insufficient documentation

## 2015-10-29 DIAGNOSIS — E538 Deficiency of other specified B group vitamins: Secondary | ICD-10-CM

## 2015-10-29 HISTORY — DX: Deficiency of other specified B group vitamins: E53.8

## 2015-10-29 LAB — CHLAMYDIA PLASMID DNA AMPLIFICATION: Chlamydia Plasmid DNA Amplification: 0

## 2015-10-29 LAB — AEROBIC CULTURE: Aerobic Culture: 0

## 2015-10-29 LAB — N. GONORRHOEAE DNA AMPLIFICATION: N. gonorrhoeae DNA Amplification: 0

## 2015-10-29 LAB — TRANSFERRIN: Transferrin: 300 mg/dL (ref 200–360)

## 2015-10-29 LAB — TRICHOMONAS DNA AMPLIFICATION: Trichomonas DNA amplification: 0

## 2015-10-30 LAB — ANGIOTENSIN CONVERTING ENZYME: Angiotensin 1 Conv Enzyme: 32 U/L (ref 9–67)

## 2015-11-01 ENCOUNTER — Ambulatory Visit: Attending: Family Medicine | Admitting: Family Medicine

## 2015-11-01 ENCOUNTER — Encounter: Payer: Self-pay | Admitting: Family Medicine

## 2015-11-01 VITALS — BP 92/54 | HR 76 | Temp 98.4°F | Ht 66.93 in | Wt 119.0 lb

## 2015-11-01 DIAGNOSIS — R110 Nausea: Secondary | ICD-10-CM

## 2015-11-01 DIAGNOSIS — Z23 Encounter for immunization: Secondary | ICD-10-CM | POA: Insufficient documentation

## 2015-11-01 DIAGNOSIS — N920 Excessive and frequent menstruation with regular cycle: Secondary | ICD-10-CM | POA: Insufficient documentation

## 2015-11-01 DIAGNOSIS — N760 Acute vaginitis: Secondary | ICD-10-CM | POA: Insufficient documentation

## 2015-11-01 DIAGNOSIS — E611 Iron deficiency: Secondary | ICD-10-CM

## 2015-11-01 DIAGNOSIS — E538 Deficiency of other specified B group vitamins: Secondary | ICD-10-CM | POA: Insufficient documentation

## 2015-11-01 LAB — HEMOGLOBIN ELECTROPHORESIS
Hgb A1: 59 % — ABNORMAL LOW (ref 96.8–97.8)
Hgb A2: 2.9 % (ref 2.2–3.2)
Hgb S: 38.1 %
Interp,HBE: ABNORMAL

## 2015-11-01 LAB — VAGINITIS SCREEN: DNA PROBE: Vaginitis Screen:DNA Probe: 0

## 2015-11-01 MED ORDER — NORELGESTROMIN-ETH ESTRADIOL 150-35 MCG/24HR TD PTWK *I*
MEDICATED_PATCH | TRANSDERMAL | 12 refills | Status: DC
Start: 2015-11-01 — End: 2016-02-09

## 2015-11-01 MED ORDER — ONDANSETRON HCL 4 MG PO TABS *I*
4.0000 mg | ORAL_TABLET | Freq: Three times a day (TID) | ORAL | 12 refills | Status: DC | PRN
Start: 2015-11-01 — End: 2018-05-01

## 2015-11-01 MED ORDER — B-6 500 MG PO TABS
2.0000 | ORAL_TABLET | Freq: Every day | ORAL | 6 refills | Status: DC
Start: 2015-11-01 — End: 2016-02-10

## 2015-11-01 MED ORDER — OMEPRAZOLE 20 MG PO CPDR *I*
20.0000 mg | DELAYED_RELEASE_CAPSULE | Freq: Every day | ORAL | 12 refills | Status: DC
Start: 2015-11-01 — End: 2018-05-01

## 2015-11-01 NOTE — Progress Notes (Signed)
Patient presents today with boyfriend, Kellie ShropshireGavin for:  IUD removal:  Wants removed due to concerns about IUD being related to frequent hospitalizations and feeling ill; Menses are monthly, last 2 days but VERY heavy blood flow;    No vaginal discharge; sexually active only with boyfriend; no condoms;      Dizziness and nausea:  No emesis; no f/ch/sweats; no abdominal pain;  No dysuria; no cp no sob no palpitations;  Appetite slightly decreased due to nausea which is episodic    Exam:   Thin female nad  VS and weight curve reviewed  Lungs cta gd AM  Cardiac RRR no m or g  Abdom: soft NABS nt/nd no masses  No CVAT  No LE edema    1. Nausea  omeprazole (PRILOSEC) 20 MG capsule daily    ondansetron (ZOFRAN) 4 MG tablet prn   2. Menorrhagia  norelgestromin-ethinyl estradiol (ORTHO EVRA) 150-35 MCG/24HR patch; importance of condoms discussed   3. Iron deficiency  norelgestromin-ethinyl estradiol (ORTHO EVRA) 150-35 MCG/24HR patch; due to severe iron deficiency, will use patch every week for at least 4 months to prevent/minimize bleeding   4. Folate deficiency  Pyridoxine HCl (B-6) 500 MG TABS   5. Vaginitis  Chlamydia plasmid DNA amplification    N. Gonorrhoeae DNA amplification    Vaginitis screen: DNA probe               6. Need for prophylactic vaccination and inoculation against influenza  Flu vaccine Quadrivalent greater than or equal to 3yo with preservative IM   Fu one month sooner prn---emphasized;

## 2015-11-02 ENCOUNTER — Encounter: Payer: Self-pay | Admitting: Family Medicine

## 2015-11-02 DIAGNOSIS — D573 Sickle-cell trait: Secondary | ICD-10-CM | POA: Insufficient documentation

## 2015-11-02 LAB — N. GONORRHOEAE DNA AMPLIFICATION: N. gonorrhoeae DNA Amplification: 0

## 2015-11-02 LAB — CHLAMYDIA PLASMID DNA AMPLIFICATION: Chlamydia Plasmid DNA Amplification: 0

## 2015-11-02 LAB — HGB ELECT,REVIEW

## 2015-11-30 ENCOUNTER — Other Ambulatory Visit
Admission: RE | Admit: 2015-11-30 | Discharge: 2015-11-30 | Disposition: A | Discharge status: Home / Self Care | Source: Ambulatory Visit | Attending: Family Medicine | Admitting: Family Medicine

## 2015-11-30 ENCOUNTER — Ambulatory Visit: Attending: Family Medicine | Admitting: Family Medicine

## 2015-11-30 ENCOUNTER — Encounter: Payer: Self-pay | Admitting: Family Medicine

## 2015-11-30 VITALS — BP 105/59 | HR 67 | Temp 98.2°F | Ht 66.93 in | Wt 120.0 lb

## 2015-11-30 DIAGNOSIS — R112 Nausea with vomiting, unspecified: Secondary | ICD-10-CM | POA: Insufficient documentation

## 2015-11-30 DIAGNOSIS — R109 Unspecified abdominal pain: Secondary | ICD-10-CM

## 2015-11-30 LAB — COMPREHENSIVE METABOLIC PANEL
ALT: 21 U/L (ref 0–35)
AST: 21 U/L (ref 0–35)
Albumin: 4.3 g/dL (ref 3.5–5.2)
Alk Phos: 94 U/L (ref 35–105)
Anion Gap: 14 (ref 7–16)
Bilirubin,Total: 0.5 mg/dL (ref 0.0–1.2)
CO2: 26 mmol/L (ref 20–28)
Calcium: 9.6 mg/dL (ref 9.0–10.4)
Chloride: 100 mmol/L (ref 96–108)
Creatinine: 0.85 mg/dL (ref 0.50–1.00)
GFR,Black: 114 *
GFR,Caucasian: 99 *
Glucose: 81 mg/dL (ref 60–99)
Lab: 5 mg/dL — ABNORMAL LOW (ref 6–20)
Potassium: 3.9 mmol/L (ref 3.3–5.1)
Sodium: 140 mmol/L (ref 133–145)
Total Protein: 7.4 g/dL (ref 6.3–7.7)

## 2015-11-30 LAB — CBC AND DIFFERENTIAL
Baso # K/uL: 0 10*3/uL (ref 0.0–0.1)
Basophil %: 0.4 %
Eos # K/uL: 0.1 10*3/uL (ref 0.0–0.4)
Eosinophil %: 1.1 %
Hematocrit: 34 % (ref 34–45)
Hemoglobin: 10.9 g/dL — ABNORMAL LOW (ref 11.2–15.7)
IMM Granulocytes #: 0 10*3/uL (ref 0.0–0.1)
IMM Granulocytes: 0.2 %
Lymph # K/uL: 2.6 10*3/uL (ref 1.2–3.7)
Lymphocyte %: 30.9 %
MCH: 25 pg/cell — ABNORMAL LOW (ref 26–32)
MCHC: 32 g/dL (ref 32–36)
MCV: 80 fL (ref 79–95)
Mono # K/uL: 0.4 10*3/uL (ref 0.2–0.9)
Monocyte %: 5.1 %
Neut # K/uL: 5.3 10*3/uL (ref 1.6–6.1)
Nucl RBC # K/uL: 0 10*3/uL (ref 0.0–0.0)
Nucl RBC %: 0 /100 WBC (ref 0.0–0.2)
Platelets: 298 10*3/uL (ref 160–370)
RBC: 4.3 MIL/uL (ref 3.9–5.2)
RDW: 16.5 % — ABNORMAL HIGH (ref 11.7–14.4)
Seg Neut %: 62.3 %
WBC: 8.5 10*3/uL (ref 4.0–10.0)

## 2015-11-30 LAB — BHCG, QUANT PREGNANCY: BHCG, QUANT PREGNANCY: <1 m[IU]/mL (ref 0–1)

## 2015-11-30 LAB — TSH: TSH: 0.29 u[IU]/mL (ref 0.27–4.20)

## 2015-11-30 LAB — T4, FREE: Free T4: 1.8 ng/dL — ABNORMAL HIGH (ref 0.9–1.7)

## 2015-12-01 ENCOUNTER — Other Ambulatory Visit: Payer: Self-pay | Admitting: Family Medicine

## 2015-12-01 DIAGNOSIS — R7989 Other specified abnormal findings of blood chemistry: Secondary | ICD-10-CM | POA: Insufficient documentation

## 2015-12-01 DIAGNOSIS — R5383 Other fatigue: Secondary | ICD-10-CM

## 2015-12-01 LAB — CHLAMYDIA PLASMID DNA AMPLIFICATION: Chlamydia Plasmid DNA Amplification: 0

## 2015-12-01 LAB — TRICHOMONAS DNA AMPLIFICATION: Trichomonas DNA amplification: 0

## 2015-12-01 LAB — N. GONORRHOEAE DNA AMPLIFICATION: N. gonorrhoeae DNA Amplification: 0

## 2015-12-01 NOTE — Progress Notes (Signed)
Patient presents today unaccompanied for:      Nausea and mild abdominal pain;  No fever or chills or sweats; no vomiting;  Decreased po intake due to nausea;  Nl BM every few days: hard;   No brbpr no melena; no dysuria;  Sexually active with one long-term female partner -no condoms; no vaginal d/c; no dysuria;  Urine is normal in quality and quantity;  Missed   contraceptive  patch for one week --2 weeks ago;  No menses for 4-5 weeks;  IUD removed last month;    Psychosocial stressors as patient has moved with boyfriend to apartment in [place] and this is further distance from family and friends;   IPV screen negative;     Exam:   Thin female nad  VS and weight curve reviewed  TMs: no erythema; nl LM  Nares no d/c no edema  Oropharynx: no erythema; no exudates  Moist mucus membranes  Neck supple nontender no masses  Lungs CTA gd AM  Cardiac RRR no m  Abdom: soft;  Mild suprapubic and LLQ/pelvic tenderness; no r/g    A/P:          1. Nausea & vomiting  CBC and differential    Comprehensive metabolic panel    TSH    T4, free    BHCG,serum (Strong)    Trichomonas DNA amplification    Chlamydia plasmid DNA amplification    N. Gonorrhoeae DNA amplification    Aerobic culture    Trichomonas DNA amplification    Chlamydia plasmid DNA amplification    N. Gonorrhoeae DNA amplification    Aerobic culture   2. Abdominal pain, unspecified abdominal location  CBC and differential    Comprehensive metabolic panel    TSH    T4, free    BHCG,serum (Strong)    Trichomonas DNA amplification    Chlamydia plasmid DNA amplification    N. Gonorrhoeae DNA amplification    Aerobic culture    Trichomonas DNA amplification    Chlamydia plasmid DNA amplification    N. Gonorrhoeae DNA amplification    Aerobic culture     Routine education, instructions and alert s/s d/wpt and emphasized;   Fu next week sooner prn

## 2015-12-02 LAB — AEROBIC CULTURE

## 2015-12-04 LAB — THYROID ANTIBODIES
Thyroglobulin Ab: <0.9 IU/mL (ref 0.0–4.0)
Thyroid Peroxidase Ab: 4.7 IU/mL (ref 0.0–9.0)

## 2015-12-06 ENCOUNTER — Telehealth: Payer: Self-pay

## 2015-12-06 ENCOUNTER — Other Ambulatory Visit: Payer: Self-pay | Admitting: Family Medicine

## 2015-12-06 DIAGNOSIS — N390 Urinary tract infection, site not specified: Secondary | ICD-10-CM

## 2015-12-06 MED ORDER — AMPICILLIN 500 MG PO CAPS *I*
500.0000 mg | ORAL_CAPSULE | Freq: Four times a day (QID) | ORAL | 0 refills | Status: DC
Start: 2015-12-06 — End: 2016-02-09

## 2015-12-06 NOTE — Telephone Encounter (Signed)
Patient requested callback with the results of recent tests.

## 2015-12-06 NOTE — Telephone Encounter (Signed)
Pt. Calling for test results.  Does her urine need to be treated?  There were sensitivities.

## 2015-12-09 ENCOUNTER — Ambulatory Visit: Admitting: Family Medicine

## 2016-01-13 ENCOUNTER — Ambulatory Visit: Admitting: Family Medicine

## 2016-01-14 ENCOUNTER — Ambulatory Visit: Admitting: Family Medicine

## 2016-01-17 ENCOUNTER — Ambulatory Visit: Attending: Family Medicine | Admitting: Family Medicine

## 2016-01-17 DIAGNOSIS — Z5321 Procedure and treatment not carried out due to patient leaving prior to being seen by health care provider: Secondary | ICD-10-CM

## 2016-01-17 NOTE — Progress Notes (Signed)
Patient was 20 minutes late to her appointment.   Nurse looked at her fingers and triaged her as she couldn't immediately be seen.     This encounter was created in error - please disregard.

## 2016-02-09 ENCOUNTER — Ambulatory Visit
Admission: RE | Admit: 2016-02-09 | Discharge: 2016-02-09 | Disposition: A | Discharge status: Home / Self Care | Source: Ambulatory Visit | Attending: Family Medicine | Admitting: Family Medicine

## 2016-02-09 ENCOUNTER — Encounter: Payer: Self-pay | Admitting: Family Medicine

## 2016-02-09 ENCOUNTER — Telehealth: Payer: Self-pay

## 2016-02-09 ENCOUNTER — Other Ambulatory Visit
Admission: RE | Admit: 2016-02-09 | Discharge: 2016-02-09 | Disposition: A | Discharge status: Home / Self Care | Source: Ambulatory Visit | Attending: Family Medicine | Admitting: Family Medicine

## 2016-02-09 ENCOUNTER — Ambulatory Visit: Attending: Family Medicine | Admitting: Family Medicine

## 2016-02-09 VITALS — BP 117/60 | HR 79 | Temp 98.8°F | Ht 66.93 in | Wt 124.0 lb

## 2016-02-09 DIAGNOSIS — F329 Major depressive disorder, single episode, unspecified: Secondary | ICD-10-CM | POA: Insufficient documentation

## 2016-02-09 DIAGNOSIS — F4320 Adjustment disorder, unspecified: Secondary | ICD-10-CM | POA: Insufficient documentation

## 2016-02-09 DIAGNOSIS — Z7289 Other problems related to lifestyle: Secondary | ICD-10-CM | POA: Insufficient documentation

## 2016-02-09 DIAGNOSIS — F32A Depression, unspecified: Secondary | ICD-10-CM

## 2016-02-09 DIAGNOSIS — N920 Excessive and frequent menstruation with regular cycle: Secondary | ICD-10-CM | POA: Insufficient documentation

## 2016-02-09 DIAGNOSIS — M79673 Pain in unspecified foot: Secondary | ICD-10-CM | POA: Insufficient documentation

## 2016-02-09 DIAGNOSIS — F4321 Adjustment disorder with depressed mood: Secondary | ICD-10-CM | POA: Insufficient documentation

## 2016-02-09 DIAGNOSIS — R946 Abnormal results of thyroid function studies: Secondary | ICD-10-CM | POA: Insufficient documentation

## 2016-02-09 DIAGNOSIS — R5383 Other fatigue: Secondary | ICD-10-CM | POA: Insufficient documentation

## 2016-02-09 DIAGNOSIS — N12 Tubulo-interstitial nephritis, not specified as acute or chronic: Secondary | ICD-10-CM | POA: Insufficient documentation

## 2016-02-09 DIAGNOSIS — Z113 Encounter for screening for infections with a predominantly sexual mode of transmission: Secondary | ICD-10-CM

## 2016-02-09 DIAGNOSIS — E611 Iron deficiency: Secondary | ICD-10-CM | POA: Insufficient documentation

## 2016-02-09 DIAGNOSIS — D649 Anemia, unspecified: Secondary | ICD-10-CM

## 2016-02-09 DIAGNOSIS — G4700 Insomnia, unspecified: Secondary | ICD-10-CM | POA: Insufficient documentation

## 2016-02-09 DIAGNOSIS — E538 Deficiency of other specified B group vitamins: Secondary | ICD-10-CM | POA: Insufficient documentation

## 2016-02-09 DIAGNOSIS — R7989 Other specified abnormal findings of blood chemistry: Secondary | ICD-10-CM

## 2016-02-09 LAB — TSH: TSH: 0.94 u[IU]/mL (ref 0.27–4.20)

## 2016-02-09 LAB — CBC AND DIFFERENTIAL
Baso # K/uL: 0 10*3/uL (ref 0.0–0.1)
Basophil %: 0.1 %
Eos # K/uL: 0 10*3/uL (ref 0.0–0.4)
Eosinophil %: 0.4 %
Hematocrit: 34 % (ref 34–45)
Hemoglobin: 10.7 g/dL — ABNORMAL LOW (ref 11.2–15.7)
IMM Granulocytes #: 0 10*3/uL (ref 0.0–0.1)
IMM Granulocytes: 0.1 %
Lymph # K/uL: 2.5 10*3/uL (ref 1.2–3.7)
Lymphocyte %: 35.1 %
MCH: 24 pg/cell — ABNORMAL LOW (ref 26–32)
MCHC: 31 g/dL — ABNORMAL LOW (ref 32–36)
MCV: 77 fL — ABNORMAL LOW (ref 79–95)
Mono # K/uL: 0.3 10*3/uL (ref 0.2–0.9)
Monocyte %: 4.5 %
Neut # K/uL: 4.2 10*3/uL (ref 1.6–6.1)
Nucl RBC # K/uL: 0 10*3/uL (ref 0.0–0.0)
Nucl RBC %: 0 /100 WBC (ref 0.0–0.2)
Platelets: 348 10*3/uL (ref 160–370)
RBC: 4.5 MIL/uL (ref 3.9–5.2)
RDW: 17.4 % — ABNORMAL HIGH (ref 11.7–14.4)
Seg Neut %: 59.8 %
WBC: 7.1 10*3/uL (ref 4.0–10.0)

## 2016-02-09 LAB — COMPREHENSIVE METABOLIC PANEL
ALT: 22 U/L (ref 0–35)
AST: 20 U/L (ref 0–35)
Albumin: 4.7 g/dL (ref 3.5–5.2)
Alk Phos: 92 U/L (ref 35–105)
Anion Gap: 9 (ref 7–16)
Bilirubin,Total: <0.2 mg/dL (ref 0.0–1.2)
CO2: 30 mmol/L — ABNORMAL HIGH (ref 20–28)
Calcium: 9.4 mg/dL (ref 9.0–10.4)
Chloride: 103 mmol/L (ref 96–108)
Creatinine: 0.84 mg/dL (ref 0.50–1.00)
GFR,Black: 115 *
GFR,Caucasian: 100 *
Glucose: 91 mg/dL (ref 60–99)
Lab: 10 mg/dL (ref 6–20)
Potassium: 4.3 mmol/L (ref 3.3–5.1)
Sodium: 142 mmol/L (ref 133–145)
Total Protein: 7.2 g/dL (ref 6.3–7.7)

## 2016-02-09 LAB — TIBC
Iron: 19 ug/dL — ABNORMAL LOW (ref 34–165)
TIBC: 420 ug/dL (ref 250–450)
Transferrin Saturation: 5 % — ABNORMAL LOW (ref 15–50)

## 2016-02-09 LAB — N. GONORRHOEAE DNA AMPLIFICATION

## 2016-02-09 LAB — T4, FREE: Free T4: 1.4 ng/dL (ref 0.9–1.7)

## 2016-02-09 LAB — FERRITIN: Ferritin: 22 ng/mL (ref 10–120)

## 2016-02-09 MED ORDER — TRAZODONE HCL 50 MG PO TABS *I*
25.0000 mg | ORAL_TABLET | Freq: Every evening | ORAL | 12 refills | Status: DC
Start: 2016-02-09 — End: 2018-05-01

## 2016-02-09 MED ORDER — NORELGESTROMIN-ETH ESTRADIOL 150-35 MCG/24HR TD PTWK *I*
MEDICATED_PATCH | TRANSDERMAL | 12 refills | Status: DC
Start: 2016-02-09 — End: 2018-05-15

## 2016-02-09 NOTE — Telephone Encounter (Signed)
TC to pt at request of provider.  X-Ray result + for fx 5th Lt toe.    Pt agrees with plan and will make appt for office visit.  Transferred to PRA to schedule.
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
## 2016-02-09 NOTE — Telephone Encounter (Signed)
-----   Message from Alcus DadSuzanne M Piotrowski, MD sent at 02/09/2016  6:19 PM EST -----  HFM RN to call patient.  L 5th toe is fractured.  Please have patient come into office tomorrow for buddy tape placement and instruction  andalso for a  prescription for ortho walking shoe.  OK to weight bear.  THANKS

## 2016-02-09 NOTE — Addendum Note (Signed)
Addended by: Alcus DadPIOTROWSKI, Peirce Deveney M on: 02/09/2016 08:58 PM     Modules accepted: Orders

## 2016-02-10 ENCOUNTER — Other Ambulatory Visit: Payer: Self-pay | Admitting: Family Medicine

## 2016-02-10 DIAGNOSIS — E538 Deficiency of other specified B group vitamins: Secondary | ICD-10-CM

## 2016-02-10 DIAGNOSIS — E611 Iron deficiency: Secondary | ICD-10-CM

## 2016-02-10 LAB — AEROBIC CULTURE

## 2016-02-10 LAB — SYPHILIS SCREEN
Syphilis Screen: NEGATIVE
Syphilis Status: NONREACTIVE

## 2016-02-10 LAB — VITAMIN B12: Vitamin B12: 265 pg/mL (ref 232–1245)

## 2016-02-10 LAB — TRANSFERRIN: Transferrin: 338 mg/dL (ref 200–360)

## 2016-02-10 LAB — HIV 1&2 ANTIGEN/ANTIBODY: HIV 1&2 ANTIGEN/ANTIBODY: NONREACTIVE

## 2016-02-10 LAB — FOLATE: Folate: 3.9 ng/mL — AB (ref >=4.6)

## 2016-02-10 MED ORDER — FERROUS SULFATE 325 (65 FE) MG PO TABS *WRAPPED* *I*
325.0000 mg | ORAL_TABLET | Freq: Every day | ORAL | 4 refills | Status: AC
Start: 2016-02-10 — End: 2016-08-08

## 2016-02-10 MED ORDER — B-6 500 MG PO TABS
2.0000 | ORAL_TABLET | Freq: Every day | ORAL | 6 refills | Status: DC
Start: 2016-02-10 — End: 2018-05-01

## 2016-02-10 MED ORDER — PRENATAL PLUS IRON 29-1 MG PO TABS *I*
1.0000 | ORAL_TABLET | Freq: Every day | ORAL | 1 refills | Status: AC
Start: 2016-02-10 — End: ?

## 2016-02-11 ENCOUNTER — Ambulatory Visit: Admitting: Nurse Practitioner

## 2016-02-11 ENCOUNTER — Other Ambulatory Visit: Payer: Self-pay | Admitting: Family Medicine

## 2016-02-11 DIAGNOSIS — N390 Urinary tract infection, site not specified: Secondary | ICD-10-CM

## 2016-02-11 LAB — N. GONORRHOEAE DNA AMPLIFICATION: N. gonorrhoeae DNA Amplification: 0

## 2016-02-11 LAB — TRICHOMONAS DNA AMPLIFICATION: Trichomonas DNA amplification: 0

## 2016-02-11 LAB — CHLAMYDIA PLASMID DNA AMPLIFICATION: Chlamydia Plasmid DNA Amplification: 0

## 2016-02-11 MED ORDER — SULFAMETHOXAZOLE-TRIMETHOPRIM 800-160 MG PO TABS *I*
1.0000 | ORAL_TABLET | Freq: Two times a day (BID) | ORAL | 0 refills | Status: DC
Start: 2016-02-11 — End: 2018-05-01

## 2016-02-12 ENCOUNTER — Telehealth: Payer: Self-pay

## 2016-02-12 NOTE — Telephone Encounter (Signed)
-----   Message from Alcus DadSuzanne M Piotrowski, MD sent at 02/12/2016 10:14 AM EST -----  Regarding: FW: Missed appointment   Please call patient today :  1.)  Patient has recurrent UTI. She has history of sepsis in the past.  Please be sure she has started her antibiotic.  Please review red flag symptoms.  2.) Patient no showed for fu of her 5th toe fracture.  Please encourage [facility].THANKS  ----- Message -----     From: Carlyon Prowsja, Deepa, NP     Sent: 02/11/2016  12:37 PM       To: Alcus DadSuzanne M Piotrowski, MD  Subject: Missed appointment                               Hi Dr. Nicolasa DuckingPiotrowski,     Just wanted to let you know that patient missed an appointment with me today.     Thank you,   Deepa

## 2016-02-12 NOTE — Telephone Encounter (Signed)
Called pt regarding Dr. Tacey HeapPiotrowski's message below. Pt stated she started the abx this morning and is aware to call and report any worsening sx.   She also voiced understanding to come to walk in clinic this Monday and to come to 02/22/16 appt with Dr. Nicolasa DuckingPiotrowski.

## 2016-02-14 LAB — VITAMIN D
25-OH VIT D2: <4 ng/mL
25-OH VIT D3: 16 ng/mL
25-OH Vit Total: 16 ng/mL — ABNORMAL LOW (ref 30–60)

## 2016-02-16 ENCOUNTER — Encounter: Payer: Self-pay | Admitting: Family Medicine

## 2016-02-16 DIAGNOSIS — E559 Vitamin D deficiency, unspecified: Secondary | ICD-10-CM | POA: Insufficient documentation

## 2016-02-16 MED ORDER — ERGOCALCIFEROL 50000 UNIT PO CAPS *I*
50000.0000 [IU] | ORAL_CAPSULE | ORAL | 0 refills | Status: AC
Start: 2016-02-16 — End: 2016-08-14

## 2016-02-22 ENCOUNTER — Ambulatory Visit: Admitting: Family Medicine

## 2016-02-22 ENCOUNTER — Encounter: Payer: Self-pay | Admitting: Family Medicine

## 2016-02-26 ENCOUNTER — Encounter: Payer: Self-pay | Admitting: Family Medicine

## 2016-02-26 DIAGNOSIS — I519 Heart disease, unspecified: Secondary | ICD-10-CM | POA: Insufficient documentation

## 2016-08-09 ENCOUNTER — Emergency Department (HOSPITAL_COMMUNITY)
Admission: EM | Admit: 2016-08-09 | Discharge: 2016-08-10 | Disposition: A | Discharge status: Home / Self Care | Payer: Self-pay | Attending: Emergency Medicine | Admitting: Emergency Medicine

## 2016-08-09 ENCOUNTER — Encounter (HOSPITAL_COMMUNITY): Payer: Self-pay

## 2016-08-09 DIAGNOSIS — R1032 Left lower quadrant pain: Secondary | ICD-10-CM

## 2016-08-09 DIAGNOSIS — N10 Acute tubulo-interstitial nephritis: Secondary | ICD-10-CM | POA: Insufficient documentation

## 2016-08-09 DIAGNOSIS — N12 Tubulo-interstitial nephritis, not specified as acute or chronic: Secondary | ICD-10-CM

## 2016-08-09 DIAGNOSIS — R112 Nausea with vomiting, unspecified: Secondary | ICD-10-CM | POA: Insufficient documentation

## 2016-08-09 HISTORY — DX: Anemia, unspecified: D64.9

## 2016-08-09 MED ORDER — ONDANSETRON HCL 4 MG/2ML IJ SOLN
4.0000 mg | Freq: Once | INTRAMUSCULAR | Status: AC
  Administered 2016-08-10: 4 mg via INTRAVENOUS
  Filled 2016-08-09: qty 2

## 2016-08-09 MED ORDER — SODIUM CHLORIDE 0.9 % IV BOLUS (SEPSIS)
1000.0000 mL | Freq: Once | INTRAVENOUS | Status: AC
  Administered 2016-08-10: 1000 mL via INTRAVENOUS

## 2016-08-09 MED ORDER — IBUPROFEN 200 MG PO TABS
600.0000 mg | ORAL_TABLET | Freq: Once | ORAL | Status: AC
  Administered 2016-08-10: 600 mg via ORAL
  Filled 2016-08-09: qty 3

## 2016-08-09 MED ORDER — OXYCODONE-ACETAMINOPHEN 5-325 MG PO TABS
1.0000 | ORAL_TABLET | Freq: Once | ORAL | Status: AC
  Administered 2016-08-10: 1 via ORAL
  Filled 2016-08-09: qty 1

## 2016-08-09 NOTE — ED Triage Notes (Signed)
Pt complains of a fever and left side pain for over a week

## 2016-08-09 NOTE — ED Provider Notes (Signed)
[facility]-EMERGENCY DEPT Provider Note   CSN: [id number] Arrival date & time: 08/09/16  2157     History   Chief Complaint Chief Complaint  Patient presents with  . Fever  . Flank Pain    HPI Brittany Curry is a 21 y.o. female.  HPI  This is a 21 year old female with no significant past medical history who presents with left lower quadrant pain. Patient reports one-week history of left lower quadrant and left back pain. She reports that it is sharp. Currently it is 10 out of 10. She reports episodes of nausea and vomiting. No diarrhea or constipation. She denies dysuria or hematuria. Last menstrual period was mid July. She denies any concerns for STDs including new vaginal discharge. She reports chills at home. Noted to be febrile to 101.2 here.  Past Medical History:  Diagnosis Date  . Anemia     There are no active problems to display for this patient.   History reviewed. No pertinent surgical history.  OB History    No data available       Home Medications    Prior to Admission medications   Medication Sig Start Date End Date Taking? Authorizing Provider  cephALEXin (KEFLEX) 500 MG capsule Take 1 capsule (500 mg total) by mouth 3 (three) times daily. 08/10/16   Shaquill Iseman, Mayer Masker, MD  doxycycline (VIBRAMYCIN) 100 MG capsule Take 1 capsule (100 mg total) by mouth 2 (two) times daily. 08/10/16   Makiah Foye, Mayer Masker, MD  naproxen (NAPROSYN) 500 MG tablet Take 1 tablet (500 mg total) by mouth 2 (two) times daily. 08/10/16   Bri Wakeman, Mayer Masker, MD  ondansetron (ZOFRAN ODT) 4 MG disintegrating tablet Take 1 tablet (4 mg total) by mouth every 8 (eight) hours as needed for nausea or vomiting. 08/10/16   Madisun Hargrove, Mayer Masker, MD    Family History History reviewed. No pertinent family history.  Social History Social History  Substance Use Topics  . Smoking status: Never Smoker  . Smokeless tobacco: Never Used  . Alcohol use No     Allergies   Patient has no known  allergies.   Review of Systems Review of Systems  Constitutional: Positive for fever.  Respiratory: Negative for shortness of breath.   Cardiovascular: Negative for chest pain.  Gastrointestinal: Positive for abdominal pain, nausea and vomiting. Negative for diarrhea.  Genitourinary: Positive for flank pain. Negative for dysuria and hematuria.  All other systems reviewed and are negative.    Physical Exam Updated Vital Signs BP (!) 107/46 (BP Location: Right Arm)   Pulse 73   Temp 98.4 F (36.9 C) (Oral)   Resp 18   LMP 07/26/2016 Comment: negative beta HCG 08/10/16  SpO2 97%   Physical Exam  Constitutional: She is oriented to person, place, and time. She appears well-developed and well-nourished. No distress.  HENT:  Head: Normocephalic and atraumatic.  Cardiovascular: Regular rhythm and normal heart sounds.   Tachycardia  Pulmonary/Chest: Effort normal and breath sounds normal. No respiratory distress. She has no wheezes.  Abdominal: Soft. Bowel sounds are normal. There is tenderness. There is no rebound and no guarding.  Left lower quadrant tenderness to palpation without rebound or guarding, no CVA tenderness  Genitourinary:  Genitourinary Comments: Normal external vaginal exam, moderate vaginal discharge, no cervical motion tenderness, no right-sided adnexal tenderness, mild left-sided adnexal tenderness  Neurological: She is alert and oriented to person, place, and time.  Skin: Skin is warm and dry.  Psychiatric: She has a normal mood and  affect.  Nursing note and vitals reviewed.    ED Treatments / Results  Labs (all labs ordered are listed, but only abnormal results are displayed) Labs Reviewed  WET PREP, GENITAL - Abnormal; Notable for the following:       Result Value   Clue Cells Wet Prep HPF POC PRESENT (*)    WBC, Wet Prep HPF POC FEW (*)    All other components within normal limits  CBC WITH DIFFERENTIAL/PLATELET - Abnormal; Notable for the following:     WBC 14.3 (*)    HCT 35.8 (*)    MCV 73.5 (*)    MCH 25.1 (*)    RDW 16.7 (*)    Neutro Abs 11.1 (*)    All other components within normal limits  BASIC METABOLIC PANEL - Abnormal; Notable for the following:    Creatinine, Ser 1.03 (*)    All other components within normal limits  URINALYSIS, ROUTINE W REFLEX MICROSCOPIC - Abnormal; Notable for the following:    APPearance CLOUDY (*)    Hgb urine dipstick SMALL (*)    Ketones, ur 5 (*)    Nitrite POSITIVE (*)    Leukocytes, UA LARGE (*)    Bacteria, UA FEW (*)    Squamous Epithelial / LPF 0-5 (*)    All other components within normal limits  URINE CULTURE  I-STAT BETA HCG BLOOD, ED ([facility], [facility], [facility] ONLY)  GC/CHLAMYDIA PROBE AMP ([place]) NOT AT [facility]    EKG  EKG Interpretation None       Radiology Ct Renal Stone Study  Result Date: 08/10/2016 CLINICAL DATA:  Left flank pain and lower abdominal pain for 1 week. White cell count 14.3. Red cells and white cells in urine. EXAM: CT ABDOMEN AND PELVIS WITHOUT CONTRAST TECHNIQUE: Multidetector CT imaging of the abdomen and pelvis was performed following the standard protocol without IV contrast. COMPARISON:  None. FINDINGS: Lower chest: Lung bases are clear. Hepatobiliary: No focal liver abnormality is seen. No gallstones, gallbladder wall thickening, or biliary dilatation. Pancreas: Unremarkable. No pancreatic ductal dilatation or surrounding inflammatory changes. Spleen: Normal in size without focal abnormality. Adrenals/Urinary Tract: No adrenal gland nodules. Kidneys are symmetrical in appearance. No renal or ureteral stones. No hydronephrosis or hydroureter. Bladder wall is diffusely thickened which may indicate cystitis. No filling defects. Stomach/Bowel: Stomach, small bowel, and colon are not abnormally distended. Stomach and small bowel are mostly decompressed. Colon is stool filled. No inflammatory changes. Appendix is normal. Vascular/Lymphatic: No significant vascular findings  are present. No enlarged abdominal or pelvic lymph nodes. Reproductive: Uterus is not enlarged. Probable right ovarian cyst measuring about 2 cm diameter. Suggestion of tubular fluid collections in the right adnexum could represent hydrosalpinx. Other: No free air or free fluid in the abdomen. Abdominal wall musculature appears intact. Musculoskeletal: No acute or significant osseous findings. IMPRESSION: 1. No renal or ureteral stone or obstruction. 2. Bladder wall thickening may indicate cystitis. 3. Probable right ovarian cyst. Tubular fluid collections in the right adnexum could represent hydrosalpinx. Electronically Signed   By: Burman NievesWilliam  Stevens M.D.   On: 08/10/2016 02:41    Procedures Procedures (including critical care time)  Medications Ordered in ED Medications  ibuprofen (ADVIL,MOTRIN) tablet 600 mg (600 mg Oral Given 08/10/16 0009)  sodium chloride 0.9 % bolus 1,000 mL (0 mLs Intravenous Stopped 08/10/16 0112)  ondansetron (ZOFRAN) injection 4 mg (4 mg Intravenous Given 08/10/16 0009)  oxyCODONE-acetaminophen (PERCOCET/ROXICET) 5-325 MG per tablet 1 tablet (1 tablet Oral Given 08/10/16  0010)  cefTRIAXone (ROCEPHIN) 1 g in dextrose 5 % 50 mL IVPB (0 g Intravenous Stopped 08/10/16 0300)  azithromycin (ZITHROMAX) tablet 1,000 mg (1,000 mg Oral Given 08/10/16 0403)     Initial Impression / Assessment and Plan / ED Course  I have reviewed the triage vital signs and the nursing notes.  Pertinent labs & imaging results that were available during my care of the patient were reviewed by me and considered in my medical decision making (see chart for details).     Patient presents with fever, back pain, left lower quadrant pain. She is nontoxic. She is febrile and mildly tachycardic. She is tender without signs of peritonitis on exam. She denies urinary symptoms but her symptoms are somewhat concerning for possible pyelonephritis. Kidney stone is also a consideration as well as ovarian pathology. Basic  labwork obtained. She has nitrite positive urine with too numerous to count white cells. Urine culture was sent. She was given IV Rocephin. CT scan was obtained to rule out kidney stone. This was negative but does show right-sided ovarian cyst and right likely hydrosalpinx. Patient has no right-sided symptoms. She states that she is not concerned for STDs but is sexually active. She was tested and empirically treated. Given her pain, I do not feel it is unreasonable to also treat for PID. She was discharged home with Keflex and doxycycline. Given that she is nontender on that side, I do not feel she needs ultrasound imaging at this time but [facility]. Patient stated understanding.  After history, exam, and medical workup I feel the patient has been appropriately medically screened and is safe for discharge home. Pertinent diagnoses were discussed with the patient. Patient was given return precautions.   Final Clinical Impressions(s) / ED Diagnoses   Final diagnoses:  Pyelonephritis  Left lower quadrant pain    New Prescriptions New Prescriptions   CEPHALEXIN (KEFLEX) 500 MG CAPSULE    Take 1 capsule (500 mg total) by mouth 3 (three) times daily.   DOXYCYCLINE (VIBRAMYCIN) 100 MG CAPSULE    Take 1 capsule (100 mg total) by mouth 2 (two) times daily.   NAPROXEN (NAPROSYN) 500 MG TABLET    Take 1 tablet (500 mg total) by mouth 2 (two) times daily.   ONDANSETRON (ZOFRAN ODT) 4 MG DISINTEGRATING TABLET    Take 1 tablet (4 mg total) by mouth every 8 (eight) hours as needed for nausea or vomiting.     Shon BatonHorton, Hadi Dubin F, MD 08/10/16 [phone]

## 2016-08-10 ENCOUNTER — Emergency Department (HOSPITAL_COMMUNITY): Payer: Self-pay

## 2016-08-10 LAB — URINALYSIS, ROUTINE W REFLEX MICROSCOPIC
BILIRUBIN URINE: NEGATIVE
GLUCOSE, UA: NEGATIVE mg/dL
KETONES UR: 5 mg/dL — AB
Nitrite: POSITIVE — AB
PH: 6 (ref 5.0–8.0)
Protein, ur: NEGATIVE mg/dL
Specific Gravity, Urine: 1.008 (ref 1.005–1.030)

## 2016-08-10 LAB — CBC WITH DIFFERENTIAL/PLATELET
BASOS PCT: 0 %
Basophils Absolute: 0 10*3/uL (ref 0.0–0.1)
Eosinophils Absolute: 0 10*3/uL (ref 0.0–0.7)
Eosinophils Relative: 0 %
HEMATOCRIT: 35.8 % — AB (ref 36.0–46.0)
HEMOGLOBIN: 12.2 g/dL (ref 12.0–15.0)
LYMPHS ABS: 2.1 10*3/uL (ref 0.7–4.0)
LYMPHS PCT: 15 %
MCH: 25.1 pg — ABNORMAL LOW (ref 26.0–34.0)
MCHC: 34.1 g/dL (ref 30.0–36.0)
MCV: 73.5 fL — AB (ref 78.0–100.0)
MONOS PCT: 7 %
Monocytes Absolute: 1 10*3/uL (ref 0.1–1.0)
NEUTROS ABS: 11.1 10*3/uL — AB (ref 1.7–7.7)
NEUTROS PCT: 78 %
Platelets: 247 10*3/uL (ref 150–400)
RBC: 4.87 MIL/uL (ref 3.87–5.11)
RDW: 16.7 % — ABNORMAL HIGH (ref 11.5–15.5)
WBC: 14.3 10*3/uL — ABNORMAL HIGH (ref 4.0–10.5)

## 2016-08-10 LAB — WET PREP, GENITAL
SPERM: NONE SEEN
Trich, Wet Prep: NONE SEEN
Yeast Wet Prep HPF POC: NONE SEEN

## 2016-08-10 LAB — BASIC METABOLIC PANEL
Anion gap: 9 (ref 5–15)
BUN: 7 mg/dL (ref 6–20)
CHLORIDE: 103 mmol/L (ref 101–111)
CO2: 23 mmol/L (ref 22–32)
CREATININE: 1.03 mg/dL — AB (ref 0.44–1.00)
Calcium: 9.2 mg/dL (ref 8.9–10.3)
GFR calc Af Amer: >60 mL/min (ref >60)
GFR calc non Af Amer: >60 mL/min (ref >60)
GLUCOSE: 94 mg/dL (ref 65–99)
Potassium: 3.6 mmol/L (ref 3.5–5.1)
Sodium: 135 mmol/L (ref 135–145)

## 2016-08-10 LAB — I-STAT BETA HCG BLOOD, ED (MC, WL, AP ONLY): I-stat hCG, quantitative: <5 m[IU]/mL (ref <5)

## 2016-08-10 MED ORDER — NAPROXEN 500 MG PO TABS: 500.0000 mg | ORAL_TABLET | Freq: Two times a day (BID) | ORAL | 0 refills | Status: AC

## 2016-08-10 MED ORDER — DEXTROSE 5 % IV SOLN
1.0000 g | Freq: Once | INTRAVENOUS | Status: AC
  Administered 2016-08-10: 1 g via INTRAVENOUS
  Filled 2016-08-10: qty 10

## 2016-08-10 MED ORDER — ONDANSETRON 4 MG PO TBDP: 4.0000 mg | ORAL_TABLET | Freq: Three times a day (TID) | ORAL | 0 refills | Status: AC | PRN

## 2016-08-10 MED ORDER — AZITHROMYCIN 250 MG PO TABS
1000.0000 mg | ORAL_TABLET | Freq: Once | ORAL | Status: AC
  Administered 2016-08-10: 1000 mg via ORAL
  Filled 2016-08-10: qty 4

## 2016-08-10 MED ORDER — DOXYCYCLINE HYCLATE 100 MG PO CAPS: 100.0000 mg | ORAL_CAPSULE | Freq: Two times a day (BID) | ORAL | 0 refills | Status: AC

## 2016-08-10 MED ORDER — CEPHALEXIN 500 MG PO CAPS: 500.0000 mg | ORAL_CAPSULE | Freq: Three times a day (TID) | ORAL | 0 refills | Status: AC

## 2016-08-10 NOTE — Discharge Instructions (Signed)
You was seen today for abdominal pain. You likely have an early kidney infection or pyelonephritis. You will be treated with antibiotics.  STD testing is pending. Your CT scan showed some right sided fluid in your tube.  This could be benign; however, it could also represent findings of pelvic inflammatory disease. Additional antibiotics will be given. [facility]. If you develop new or worsening pain, worsening fevers or any new endorsing symptoms you should be reevaluated.

## 2016-08-11 LAB — GC/CHLAMYDIA PROBE AMP (~~LOC~~) NOT AT ARMC
Chlamydia: NEGATIVE
Neisseria Gonorrhea: NEGATIVE

## 2016-08-12 LAB — URINE CULTURE

## 2016-08-13 ENCOUNTER — Telehealth: Payer: Self-pay

## 2016-08-13 NOTE — Telephone Encounter (Signed)
Post ED Visit - Positive Culture Follow-up  Culture report reviewed by antimicrobial stewardship pharmacist:  []  Enzo BiNathan Batchelder, Pharm.D. []  Celedonio MiyamotoJeremy Frens, Pharm.D., BCPS AQ-ID []  Garvin FilaMike Maccia, Pharm.D., BCPS []  Georgina PillionElizabeth Martin, Pharm.D., BCPS []  Park RapidsMinh Pham, [address] Rainbow BoulevardPharm.D., BCPS, AAHIVP []  Estella HuskMichelle Turner, Pharm.D., BCPS, AAHIVP [x]  Lysle Pearlachel Rumbarger, PharmD, BCPS []  Casilda Carlsaylor Stone, PharmD, BCPS []  Pollyann SamplesAndy Johnston, PharmD, BCPS  Positive urine culture Treated with Cephalexin, organism sensitive to the same and no further patient follow-up is required at this time.  Jerry CarasCullom, Jhordyn Hoopingarner Burnett 08/13/2016, 9:25 AM

## 2016-08-25 ENCOUNTER — Ambulatory Visit: Admitting: Family Medicine

## 2016-09-08 ENCOUNTER — Telehealth: Payer: Self-pay

## 2016-09-08 NOTE — Telephone Encounter (Signed)
[place]. [place] in [place]09/16.  Was diagnosed with a UTI and PID.  Said that's he was prescribed several medications, but never received them. "To be honest, I forgot about them".  "Then, when I went to get them about 2 weeks later, I couldn't because of insurance".  Pt. Still has left flank pain; has Hx of pyelo with urosepsis.  States she still has mid to lower abdominal tenderness.  Denies fever and denies vaginal discharge.  Discussed with Dr. Jeanella CrazePierce, who recommends that pt. Go to the ED, as we have no openings.  Will need testing.  Writer advised pt. Of this. Writer stressed the importance of this as she knows how sick she gets.  [facility] and they are aware.

## 2016-09-10 ENCOUNTER — Encounter: Payer: Self-pay | Admitting: Emergency Medicine

## 2016-09-10 ENCOUNTER — Emergency Department
Admission: EM | Admit: 2016-09-10 | Discharge: 2016-09-10 | Disposition: A | Discharge status: Home / Self Care | Source: Ambulatory Visit | Attending: Emergency Medicine | Admitting: Emergency Medicine

## 2016-09-10 DIAGNOSIS — Z3A00 Weeks of gestation of pregnancy not specified: Secondary | ICD-10-CM

## 2016-09-10 DIAGNOSIS — O26891 Other specified pregnancy related conditions, first trimester: Secondary | ICD-10-CM

## 2016-09-10 DIAGNOSIS — Z3490 Encounter for supervision of normal pregnancy, unspecified, unspecified trimester: Secondary | ICD-10-CM | POA: Insufficient documentation

## 2016-09-10 DIAGNOSIS — R102 Pelvic and perineal pain: Secondary | ICD-10-CM

## 2016-09-10 DIAGNOSIS — M545 Low back pain: Secondary | ICD-10-CM

## 2016-09-10 LAB — CBC AND DIFFERENTIAL
Baso # K/uL: 0 10*3/uL (ref 0.0–0.1)
Basophil %: 0.2 %
Eos # K/uL: 0.1 10*3/uL (ref 0.0–0.4)
Eosinophil %: 0.7 %
Hematocrit: 32 % — ABNORMAL LOW (ref 34–45)
Hemoglobin: 10.5 g/dL — ABNORMAL LOW (ref 11.2–15.7)
IMM Granulocytes #: 0 10*3/uL (ref 0.0–0.1)
IMM Granulocytes: 0.4 %
Lymph # K/uL: 2 10*3/uL (ref 1.2–3.7)
Lymphocyte %: 22.5 %
MCH: 26 pg (ref 26–32)
MCHC: 33 g/dL (ref 32–36)
MCV: 79 fL (ref 79–95)
Mono # K/uL: 0.6 10*3/uL (ref 0.2–0.9)
Monocyte %: 6.8 %
Neut # K/uL: 6.2 10*3/uL — ABNORMAL HIGH (ref 1.6–6.1)
Nucl RBC # K/uL: 0 10*3/uL (ref 0.0–0.0)
Nucl RBC %: 0 /100 WBC (ref 0.0–0.2)
Platelets: 233 10*3/uL (ref 160–370)
RBC: 4 MIL/uL (ref 3.9–5.2)
RDW: 18.5 % — ABNORMAL HIGH (ref 11.7–14.4)
Seg Neut %: 69.4 %
WBC: 8.9 10*3/uL (ref 4.0–10.0)

## 2016-09-10 LAB — BHCG, QUANT PREGNANCY: BHCG, QUANT PREGNANCY: 96143 m[IU]/mL — ABNORMAL HIGH (ref 0–1)

## 2016-09-10 LAB — POCT URINALYSIS DIPSTICK
Blood,UA POCT: NEGATIVE
Glucose,UA POCT: NORMAL mg/dL
Ketones,UA POCT: NEGATIVE mg/dL
Leuk Esterase,UA POCT: 1 — AB
Lot #: 30953404
Nitrite,UA POCT: NEGATIVE
PH,UA POCT: 5 (ref 5–8)
Protein,UA POCT: NEGATIVE mg/dL

## 2016-09-10 LAB — COMPREHENSIVE METABOLIC PANEL
ALT: 14 U/L (ref 0–35)
AST: 19 U/L (ref 0–35)
Albumin: 4.1 g/dL (ref 3.5–5.2)
Alk Phos: 66 U/L (ref 35–105)
Anion Gap: 13 (ref 7–16)
Bilirubin,Total: 0.4 mg/dL (ref 0.0–1.2)
CO2: 21 mmol/L (ref 20–28)
Calcium: 9.2 mg/dL (ref 9.0–10.4)
Chloride: 103 mmol/L (ref 96–108)
Creatinine: 0.75 mg/dL (ref 0.51–0.95)
GFR,Black: 132 *
GFR,Caucasian: 114 *
Globulin: 2.4 g/dL — ABNORMAL LOW (ref 2.7–4.3)
Glucose: 67 mg/dL (ref 60–99)
Lab: 7 mg/dL (ref 6–20)
Potassium: 4.7 mmol/L (ref 3.3–5.1)
Sodium: 137 mmol/L (ref 133–145)
Total Protein: 6.5 g/dL (ref 6.3–7.7)

## 2016-09-10 LAB — POCT URINE PREGNANCY: Preg Test,UR POC: POSITIVE — AB

## 2016-09-10 MED ORDER — ACETAMINOPHEN 500 MG PO TABS *I*
1000.0000 mg | ORAL_TABLET | Freq: Once | ORAL | Status: AC
Start: 2016-09-10 — End: 2016-09-10
  Administered 2016-09-10: 1000 mg via ORAL
  Filled 2016-09-10: qty 2

## 2016-09-10 NOTE — Discharge Instructions (Signed)
You were seen today for pain during early pregnancy. While in the Emergency Department you received tylenol. Ultrasound shows an early pregnancy in your uterus.     Make sure to get lots of rest, drink plenty of healthy fluids, and eat a nutritious balanced diet. You may take tylenol over-the-counter as needed for pain, use as directed on the label. [facility] - expect a call from them to schedule an appointment.     If symptoms worsen or new symptoms develop that are concerning to you, call your doctor or return to the Emergency Department.

## 2016-09-10 NOTE — ED Triage Notes (Signed)
Pt reports pain to left lower pelvic region and left side flank pain.        Triage Note   Adriana ReamsLisa Paizley Ramella, RN

## 2016-09-10 NOTE — ED Notes (Signed)
Pt presents to ED with c/o lowe pelvic pain x 2 months. Pt was seen one month ago in the ED and was dx with PID. Pt states she was put on medication but never "picked it up" so PCP sent pt back to ED with ge reevaluated. Pt states she has not gotten period in a couple of months. Pt states she has loss of appetite and when she gets hungry "she feels nauseated.

## 2016-09-10 NOTE — ED Procedure Documentation (Signed)
Procedures   Ultrasound - Transabdominal Pelvic  Date/Time: 09/10/2016 3:19 PM  Performed by: [place] SenateALOISIO, [name]  Authorized by: [place] SenateALOISIO, Monico Sudduth       Procedure Details:     Indications: pregnancy with abd / pelvic pain    Trimester: presumed 1st trimester    Assessment For: intrauterine pregnancy      Structures:       Uterus: visualized    Bladder: visualized    Cul de sac: visualized     Exam Limitations: bowel gas      Findings:    Gestational sac: intrauterine      Yolk sac:   Fetal pole: yes   Fetal Heart activity: yes   Fetal Heart rate (bpm): 160        Pelvic Free Fluid: no      Impression:    intrauterine pregnancy           Images were interpreted by me and [facility] to [facility].                Salem SenateNicholas Raeann Offner, MD     Salem SenateAloisio, Roseland Braun, MD  09/10/16 [phone]

## 2016-09-10 NOTE — ED Notes (Signed)
Discharge instructions, medications, and follow-up care reviewed with patient. Patient verbalized understanding of discharge instructions, medications, and follow-up care. Pt. Ambulatory and discharged home with friend via car in stable condition.

## 2016-09-10 NOTE — ED Provider Notes (Signed)
History     Chief Complaint   Patient presents with    Pelvic Pain     Pain to left lower side    Flank Pain     left side      HPI Comments: Nichole Carter is a 21 y.o. female who is G2 P0010 with a prior elective abortion who presents with pain. Left pelvis, radiating to left lower back. Associated with nausea and decreased appetite. Ongoing for more than one month. She reports that she presented to a hospital in [place] when she was there one month ago and was diagnosed with "a kidney infection and PID". She received antibiotics during her visit and was prescribed additional medications. However, she never picked these up.     Her symptoms have continued. She spoke to her PCP here in [place] on two days ago, who referred her to the ED for treatment.       History provided by:  Patient and significant other      Medical/Surgical/Family History     Past Medical History:   Diagnosis Date    Anxiety     Bacteremia 08/15/2015    Depression         Patient Active Problem List   Diagnosis Code    Healthcare maintenance Z00.00    Shoulder pain M25.519    Anemia D64.9    Atypical chest pain R07.89    Pyelonephritis N12    Iron deficiency E61.1    Folate deficiency E53.8    Sickle cell trait D57.3    Abnormal thyroid blood test R94.6    Insomnia G47.00    Deliberate self-cutting Z72.89    Depression F32.9    Grief F43.20    Menorrhagia N92.0    Avitaminosis D E55.9    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 No pertinent surgical history.  Family History   Problem Relation Age of Onset    Diabetes Mother     No Known Problems Father     High cholesterol Paternal Aunt     Diabetes Paternal Aunt           Social History   Substance Use Topics    Smoking status: Never Smoker    Smokeless tobacco: Never Used    Alcohol use No     Living Situation     Questions Responses    Patient lives with Family    Homeless     Caregiver for other family member     External  Services     Employment     Domestic Violence Risk                 Review of Systems   Review of Systems   Constitutional: Positive for appetite change. Negative for chills, diaphoresis, fatigue and fever.   Eyes: Negative for visual disturbance.   Respiratory: Negative for cough and shortness of breath.    Cardiovascular: Negative for chest pain.   Gastrointestinal: Negative for nausea and vomiting.   Genitourinary: Positive for pelvic pain. Negative for decreased urine volume, difficulty urinating, dysuria, hematuria, vaginal bleeding, vaginal discharge and vaginal pain.   Musculoskeletal: Positive for back pain.   Skin: Negative for rash.   Neurological: Negative for syncope and light-headedness.   Psychiatric/Behavioral: Negative for confusion.       Physical Exam     Triage Vitals  Triage Start: Start, (09/10/16 1026)  First Recorded BP: 106/59, Resp: 16, Temp: 36.7 C (98.1 F), Temp src: TEMPORAL Oxygen Therapy SpO2: 100 %, Oximetry Source: Rt Hand, O2 Device: None (Room air), Heart Rate: 89, (09/10/16 1028)  .  First Pain Reported  0-10 Scale: 5, Pain Location/Orientation: Flank Left;Pelvis, (09/10/16 1028)       Physical Exam   Constitutional: She is oriented to person, place, and time. She appears well-developed. No distress.   Sitting upright in bed, alert, comfortable appearing, calm, smiley and interactive.    HENT:   Head: Normocephalic and atraumatic.   Right Ear: External ear normal.   Left Ear: External ear normal.   Nose: Nose normal.   Mouth/Throat: Oropharynx is clear and moist.   Eyes: Conjunctivae are normal. Right eye exhibits no discharge. Left eye exhibits no discharge. No scleral icterus.   Neck: Normal range of motion. Neck supple.   Cardiovascular: Normal rate, regular rhythm, normal heart sounds and intact distal pulses.    Pulmonary/Chest: Effort normal and breath sounds normal. No respiratory distress. She has no wheezes. She has no rales.   Abdominal: Soft. Bowel sounds are normal.  She exhibits no distension. There is no hepatosplenomegaly. There is no tenderness. There is no rigidity, no rebound, no guarding, no tenderness at McBurney's point and negative Murphy's sign.   Genitourinary:   Genitourinary Comments: Pelvic exam offered and recommended. However, after pt was informed that she was pregnant and would be referred to [facility], she declined in lieu of having this done with Ob when she follows up.    Musculoskeletal: Normal range of motion. She exhibits no edema.   Neurological: She is alert and oriented to person, place, and time. She exhibits normal muscle tone.   Skin: Skin is warm and dry. No rash noted. She is not diaphoretic.   Psychiatric: She has a normal mood and affect. Her behavior is normal. Thought content normal.   Nursing note and vitals reviewed.      Medical Decision Making      Amount and/or Complexity of Data Reviewed  Clinical lab tests: ordered and reviewed  Decide to obtain previous medical records or to obtain history from someone other than the patient: yes  Obtain history from someone other than the patient: yes  Review and summarize past medical records: yes  Independent visualization of images, tracings, or specimens: yes        Initial Evaluation:  ED First Provider Contact     Date/Time Event User Comments    09/10/16 1123 ED First Provider Contact Elsey Holts Initial Face to Face Provider Contact          Patient seen by me on arrival date of 09/10/2016.    Assessment:  21 y.o.female comes to the ED with pelvic and back pain. Pt reports that she was diagnosed with, and perhaps partially treated for, PID and/or a UTI. Her symptoms and lab results today do not appear to be consistent with ongoing PID (no discharge, no significant tenderness, no leukocytosis) or an untreated UTI (UA not c/w UTI).     Pregnancy test positive. Bedside ED pelvic US confirms an IUP with normal FHR.       Lab results: 09/10/16  1255  10/17/15  0120   WBC 8.9  < > 6.3   Hemoglobin  10.5*  < > 7.0*   Hematocrit 32*  < > 21*   RBC 4.0  < > 2.7*   Platelets 233  < > 191   Seg  Neut % 69.4  < > 55.0   Lymphocyte % 22.5  < > 36.0   Monocyte % 6.8  < > 6.0   Eosinophil % 0.7  < > 2.0   Basophil % 0.2  < > 0.0   Bands %  --   --  1   < > = values in this interval not displayed.        Lab results: 09/10/16  1255 02/09/16  1500 11/30/15  1231   Sodium 137 142 140   Potassium 4.7 4.3 3.9   Chloride 103 103 100   CO2 21 30* 26   UN 7 10 5*   Creatinine 0.75 0.84 0.85   GFR,Caucasian 114 100 99   GFR,Black 132 115 114   Glucose 67 91 81   Calcium 9.2 9.4 9.6   Total Protein 6.5 7.2 7.4   Albumin 4.1 4.7 4.3   ALT '14 22 21   ' AST '19 20 21   ' Alk Phos 66 92 94   Bilirubin,Total 0.4 <0.2 0.5         Recent Labs  Lab 09/10/16  1255   Total Protein 6.5   Albumin 4.1   Bilirubin,Total 0.4   Alk Phos 66   AST 19   ALT 14           Lab results: 09/10/16  1127 10/16/15  1027   Appearance,UR  --  CLEAR   Color, UA  --  Lt Yellow*   Glucose,UA  --  NORM   Ketones, UA  --  NEG   Specific Gravity,UA  --  1.006   Blood,UA  --  NEG   pH,UA  --  5.0   Protein,UA  --  NEG   Nitrite,UA  --  NEG   Leuk Esterase,UA  --  TRACE*   RBC,UA  --  NONE SEEN   WBC,UA  --  6 - 10*   Glucose,UA POCT Normal  --    Ketones,UA POCT Negative  --    Specific gravity,UA POCT Test Not Performed  --    Blood,UA POCT Negative  --    PH,UA POCT 5.0  --    Protein,UA POCT Negative  --    Nitrite,UA POCT Negative  --    Leuk Esterase,UA POCT +1*  --                   Differential Diagnosis includes:  Abdominal pain during pregnancy; IUP; not c/w UTI; metabolic disturbance, anemia.     Plan:  Diagnostics: cbc, cmp, ruq panel, bhcg, ua; pelvic exam and cultures declined by patient in lieu of outpatient Ob follow-up   Therapeutic: tylenol         Sinclair Grooms, MD            Sinclair Grooms, MD  09/10/16 [phone]

## 2016-09-12 ENCOUNTER — Ambulatory Visit: Admitting: Pediatrics

## 2017-04-21 ENCOUNTER — Ambulatory Visit: Attending: Family Medicine | Admitting: Internal Medicine

## 2017-04-21 VITALS — BP 119/65 | HR 72 | Temp 98.2°F | Ht 67.0 in | Wt 131.0 lb

## 2017-04-21 DIAGNOSIS — N898 Other specified noninflammatory disorders of vagina: Secondary | ICD-10-CM | POA: Insufficient documentation

## 2017-04-21 DIAGNOSIS — Z202 Contact with and (suspected) exposure to infections with a predominantly sexual mode of transmission: Secondary | ICD-10-CM | POA: Insufficient documentation
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
## 2017-04-22 LAB — VAGINITIS SCREEN: DNA PROBE: Vaginitis Screen:DNA Probe: 0

## 2017-04-23 ENCOUNTER — Encounter: Payer: Self-pay | Admitting: Internal Medicine

## 2017-04-23 LAB — N. GONORRHOEAE DNA AMPLIFICATION: N. gonorrhoeae DNA Amplification: 0

## 2017-04-23 LAB — CHLAMYDIA PLASMID DNA AMPLIFICATION: Chlamydia Plasmid DNA Amplification: POSITIVE — AB

## 2017-04-23 MED ORDER — AZITHROMYCIN 250 MG PO TABS *I*
1000.0000 mg | ORAL_TABLET | Freq: Once | ORAL | 0 refills | Status: AC
Start: 2017-04-23 — End: 2017-04-23

## 2017-04-23 NOTE — Telephone Encounter (Signed)
TC to pharmacy.  Pt has not yet picked up prescription for azithromycin.

## 2017-04-23 NOTE — Telephone Encounter (Signed)
TC to pt.  L/M requesting return phone call.

## 2017-04-24 ENCOUNTER — Telehealth: Payer: Self-pay

## 2017-04-24 NOTE — Telephone Encounter (Signed)
LMOM to call back

## 2017-04-25 ENCOUNTER — Telehealth: Payer: Self-pay

## 2017-04-25 NOTE — Telephone Encounter (Signed)
Third day trying to reach patient . LVM on both numbers listed in chart [facility]. Per   [facility] , pt has not picked up azithronmycin prescription yet. Writer requested pharmacy also try to contact pt and state they will .

## 2017-04-26 NOTE — Telephone Encounter (Signed)
RN spoke to pt and relayed MD recommendation per note below. Pt states will probably pick up medicine this evening. Pt verbalizes understanding, had no further questions. Writer did discuss with her future lab orders in system for HIV and syphyllis , pt states plans on doing but just forgot.         Hi, I sent a mychart message but please also call patient and let her know her chlamydia swab was positive, and I sent a prescription for a 1g dose of [facility]ithromycin to the [facility] [address] and she should get treated as soon as possible, and refrain from sex until she and her partner are treated. Thanks much, Teacher, early years/pre-Mike (Routing comment

## 2017-05-01 ENCOUNTER — Telehealth: Payer: Self-pay

## 2017-05-01 NOTE — Telephone Encounter (Signed)
Writer [facility] for dx [facility]lam[facility].  Writer notes hast there were a few attempts to reach pt., but no contact made.  Nurse had called the pharmacy to request that they try to reach pt.  Writer called [facility] today; pt. Has still not picked up her medication.  Writer will send pt.a My Chart message; writer also contacted [facility] to let them know that we haven't reached the pt.  Writer left my direct phone number for a return call.

## 2017-05-02 NOTE — Telephone Encounter (Signed)
Writer called pt. And left a message to please return call.  Writer did not leave a message at [employer] phone number.

## 2017-05-02 NOTE — Telephone Encounter (Signed)
Writer called [facility] and spoke to Diane.  They have already referred pt. And will try to track her down.  We do not need to try anymore.

## 2017-05-28 NOTE — Telephone Encounter (Signed)
Please call back as soon as possible regarding patient's treatment.

## 2017-05-29 NOTE — Telephone Encounter (Signed)
Please follow up regarding the message below.

## 2017-05-30 ENCOUNTER — Telehealth: Payer: Self-pay | Admitting: Primary Care

## 2017-05-30 NOTE — Telephone Encounter (Signed)
Called and left a message to please return call.

## 2017-05-30 NOTE — Telephone Encounter (Signed)
Delfina Redwood [facility] [phone] called is requesting a call back ASAP states the patient has an active infection.

## 2017-05-30 NOTE — Telephone Encounter (Signed)
See other telephone call.  Pt. Is scheduled to be seen on 5/28.  Needs treatment.

## 2017-05-30 NOTE — Telephone Encounter (Signed)
Gardiner Ramus said that they have not been able to reach this pt.  She is untreated.  Has appt. With [place] on 5/28.  Wants to know if we can treat her when she comes in?  Writer assured her that we can.  She will continue to try to reach the pt. In the meantime.

## 2017-06-05 ENCOUNTER — Ambulatory Visit: Payer: Self-pay | Admitting: Primary Care

## 2017-06-06 NOTE — Telephone Encounter (Signed)
Writer attempted to reach pt. Anyway; reached her at the mobile phone number that is listed.  Informed her that we have been trying to reach her since her last visit because she has chlamydia.  Pt. States she has been in [place] since then.  Writer discussed this with the health department; they will notify the local health department at [address] and pt. Can pick up her Rx today before 3:30 PM.  Pt. Agrees to go there for treatment today.

## 2017-06-06 NOTE — Telephone Encounter (Signed)
Writer called Nichole Carter and informed her that pty. Did not show up for her appt. Yesterday.  She will continue to try to reach her and will reach out to her emergency contact.

## 2018-01-09 NOTE — L&D Delivery Note (Addendum)
Delivery Summary Note  Patient: Nichole Carter Carter  Age: 23 y.o.  Date of Birth: 01-27-1995  [id number]  MRN: [id number]    Admission Summary  Date and time of admission: 11/13/2018 12:33 PM Attending Provider: Hedy Jacob, MD Provider Group: [facility]    Diagnosis    NSVD (normal spontaneous vaginal delivery)     Allergies:   Allergies: No Known Allergies (drug, envir, food or latex)  Weight:    Weight: 65.8 kg (145 lb)     OB History   Gravida Para Term Preterm AB Living   2 1 1  0 1 1   SAB TAB Ectopic Multiple Live Births   0 1 0 0 1      Breast or Formula Feeding: Breast feeding      Prenatal Labs  ABO RH Blood Type   Date Value Ref Range Status   11/13/2018 O RH POS  Final     Antibody Screen   Date Value Ref Range Status   11/13/2018 Negative  Final     Rubella IgG AB   Date Value Ref Range Status   05/13/2018 POSITIVE  Final     Comment:     TEST METHOD: Multiplex flow immunoassay     HBV Core Ab   Date Value Ref Range Status   06/15/2015 NEG  Final     Comment:     Test Method: CMIA     HBV S Ab   Date Value Ref Range Status   06/15/2015 POS  Final     Comment:     Test Method: CMIA     HBV S Ab Quant   Date Value Ref Range Status   06/15/2015 >1,000.00 mIU/mL Final     Comment:     Immunity to HBV is implied by anti-HBs values greater than  or equal to 12 mIU/mL. This result may represent the antibody  response to successful HBV immunization, to past HBV  infection or passively acquired antibody (e.g.tranfusion).  Post-immunization antibody testing guidelines for the general  public are described in MMWR 1990;39(S2):1-23 and for health-  care workers in [place] 1997;46(No.RR-18).  Interpretive Criteria:  < 8.0 mIU/mL           Negative  8.0 to 12.0 mIU/mL     Indeterminate  12.0 to > 1000 mIU/mL  Positive  Test Method:CMIA       HBV S Ag   Date Value Ref Range Status   05/13/2018 NEG  Final     Comment:     Test Method: CMIA     Group B Strep Culture   Date Value Ref Range Status   10/25/2018 .   Final     HIV 1&2 ANTIGEN/ANTIBODY   Date Value Ref Range Status   05/13/2018 Nonreactive  Final     Comment:     Test Method: CMIA     HM HIV SCREENING OFFERED   Date Value Ref Range Status   04/03/2015 Accepted  Final      Dating Information  Patient's last menstrual period was 02/14/2018. EDD: 11/21/2018, by Last Menstrual Period  Information for the patient's newborn:  Nichole Carter, Carter [[id number]]     Delivery Information  Boy Nichole Carter Carter  Sex: female Gestational Age: [redacted]w[redacted]d MRN: [id number] PCP: Nichole Caprice, MD   Delivery Date/Time: 11/14/2018 7:04 AM   Time of Head Delivery: 11/14/2018  7:04 AM  Delivery Type: Vaginal, Spontaneous        Meconium at time  of delivery: none  Delivery Location: labor flr    Labor Onset Date/Time:     Dilation Complete Date/Time:         Preterm labor: No Antenatal steroids: None Antibiotics received during labor:        First Cervical ripening date/time:       Cervical ripening Type: Misoprostol        Rupture Date: 11/14/2018 Rupture Time: 6:46 AM  Details:   Rupture Type: Spontaneous;Bulging Color: Bloody Amount:   Fluid odor:None  Induction: Misoprostol  Induction start date/time11/04/2018 2:41 PM      Augmentation:        Labor complications:       Providers    Delivering clinician: Carolin Coy, MD   Other personnel:  Provider Role   Nichole Pellant, DO Delivery Assist   Nichole Partridge, MD OB Resident   Nichole Carter Carter Delivery Nurse   Nichole Morita, RN Registered Nurse   Nichole Carter Hawking, RN Registered Nurse   Nichole Carter Carter Medical Student           Anesthesia Method: None-   Analgesics:        Presentation: Vertex Position: Middle Occiput Anterior  Prophylactic Maneuver: No     Shoulder Dystocia: No                                                                                             Skin to Skin/Bonding: Skin to skin initiation date & time: 11/14/2018  7:04 AM  with: Mom        Breastfeeding intiated: 11/14/2018  8:00 AM  Resuscitation: Dry;Tactile Stimulation;Bulb Suctioning      Living Status: Living             APGARs Total Color Reflex irritability Breath Heart Rate Muscle Tone Assigned By   (greater than 7 no need for next measurement)   1 min 8  0  2  2  2  2   N. Turiano RN    5 min 9  1  2  2  2  2   N. Turiano RN    10 min                 15 min                 20 min                 25 min                 30 min                   Birth Weight: 3033 g (6 lb 11 oz) Height: 20.5" Head Circumference: 34.3 cm Observed Anomalies:      Cord: 3 Vessels     Complications: NONE          Clamped Date/Time:11/14/2018  7:05 AM  Cord blood disposition: Lab;Refrigerator     Gases sent: No       Stem cell collection -by MD-: No   Maternal Info:   Placenta Delivery Date/Time: 11/14/2018  7:07 AM  Removal: Spontaneous     Appearance: Intact       Disposition: pathology  Bonding:     Stages of Labor:          Stage One:   h   m          Stage Two:   h   m          Stage Three:  0h  5428m  Episiotomy: None      Lacerations: None                                                Needle Count: Correct        Sponge Count: Correct      Procedures: None       Vaginal Delivery Blood Loss mL: 32              Intraprocedure I/O Totals     None         Delivery Narrative:    Nichole Carter Carter is a 23 y.o. G2P0010, now P1011, admitted at 175w6d gestation for IOL for IUGR.  Pregnancy complicated by IUGR, sickle cell trait, depression, history of endocarditis    Patient was started on Miso for 4 doses, she then received Nubain for pain control.    The patient progressed to fully dilated and began pushing.  There was delivery of a viable female infant in the right occiput anterior presentation via normal spontaneous vaginal delivery weighing 3033 grams with Apgars of 8 and 9. Skin to skin initiated immediately following delivery, then infant was brought to the warmer due to mother's fatigue. 3 vessel cord clamped and cut. Spontaneous delivery of intact placenta followed by administration of pitocin. Uterine massage performed  until firm and hemostasis achieved. No laceration noted   Nuchal cord: No   NICU in attendance: No   Spontaneous delivery of placenta intact: Yes   Lacerations: No lacerations noted   Maternal condition: Good   Infant condition: Good    Dr. Luana ShuLinder present for delivery.    Nichole Pellantristina Enrique, DO  Maternal Child Health Fellow   [facility]  [facility]     Attending Attestation:      I was present throughout the entire procedure.   23 y.o. G2->P1011 at 595w6d admitted for induction 2/2 poor fetal growth, progressed to fully dilated and had a spontaneous delivery of a live female infant.  The infant was fully delivered, the cord was clamped and cut.  The infant was placed on maternal abdomen. The placenta delivered spontaneously and was intact with 3VC.  The perineum was inspected and noted to be intact.  Mom and baby tolerated delivery well.   QBL as above.     Nichole Carter CoyMitchell Suzzanne Brunkhorst, MD  [facility]

## 2018-02-13 ENCOUNTER — Telehealth: Payer: Self-pay

## 2018-02-13 NOTE — Telephone Encounter (Signed)
[facility] Worker Note    Summary:    Called and left a message to please return my call, called to inquire if patient has had a recent pap smear or needs to schedule one.     Upcoming appointments:    No future appointments.

## 2018-05-01 ENCOUNTER — Ambulatory Visit: Payer: Medicaid (Managed Care) | Admitting: Obstetrics and Gynecology

## 2018-05-01 DIAGNOSIS — N926 Irregular menstruation, unspecified: Secondary | ICD-10-CM
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
## 2018-05-01 NOTE — Patient Instructions (Signed)
Patient Education   Pregnancy   AMBULATORY CARE:   What you need to know about pregnancy:  A normal pregnancy lasts about 40 weeks. The first trimester lasts from your last period through the 12th week of pregnancy. The second trimester lasts from the 13th week of your pregnancy through the 23rd week. The third trimester lasts from your 24th week of pregnancy until your baby is born. If you know the date of your last period, your healthcare provider can estimate your due date. You may give birth to your baby any time from 37 weeks to 2 weeks after your due date.  Seek care immediately if:   · You develop a severe headache that does not go away.    · You have new or increased vision changes, such as blurred or spotted vision.    · You have new or increased swelling in your face or hands.    · You have pain or cramping in your abdomen or low back.    · You have vaginal bleeding.    Contact your healthcare provider or obstetrician if:   · You have abdominal cramps, pressure, or tightening.    · You have a change in vaginal discharge.    · You cannot keep food or drinks down, and you are losing weight.    · You have chills or a fever.    · You have vaginal itching, burning, or pain.     · You have yellow, green, white, or foul-smelling vaginal discharge.    · You have pain or burning when you urinate, less urine than usual, or pink or bloody urine.    · You have questions or concerns about your condition or care.    Body changes that may occur during your pregnancy:   · Breast changes  you will experience include tenderness and tingling during the early part of your pregnancy. Your breasts will become larger. You may need to use a support bra. You may see a thin, yellow fluid, called colostrum, leak from your nipples during the second trimester. Colostrum is a liquid that changes to milk about 3 days after you give birth.    · Skin changes and stretch marks  may occur during your pregnancy. You may have red marks,  called stretch marks, on your skin. Stretch marks will usually fade after pregnancy. Use lotion if your skin is dry and itchy. The skin on your face, around your nipples, and below your belly button may darken. Most of the time, your skin will return to its normal color after your baby is born.     · Morning sickness  is nausea and vomiting that can happen at any time of day. Avoid fatty and spicy foods. Eat small meals throughout the day instead of large meals. Ginger may help to decrease nausea. Ask your healthcare provider about other ways of decreasing nausea and vomiting.    · Heartburn  may be caused by changes in your hormones during pregnancy. Your growing uterus may also push your stomach upward and force stomach acid to back up into your esophagus. Eat 4 or 5 small meals each day instead of large meals. Avoid spicy foods. Avoid eating right before bedtime.    · Constipation  may develop during your pregnancy. To treat constipation, eat foods high in fiber such as fiber cereals, beans, fruits, vegetables, whole-grain breads, and prune juice. Get regular exercise and drink plenty of water. Your healthcare provider may also suggest   a fiber supplement to soften your bowel movements. Talk to your healthcare provider before you use any medicines to decrease constipation.    · Hemorrhoids  are enlarged veins in the rectal area. They may cause pain, itching, and bright red bleeding from your rectum. To decrease your risk of hemorrhoids, prevent constipation and do not strain to have a bowel movement. If you have hemorrhoids, soak in a tub of warm water to ease discomfort. Ask your healthcare provider how you can treat hemorrhoids.     · Leg cramps and swelling  may be caused by low calcium levels or the added weight of pregnancy. Raise your legs above the level of your heart to decrease swelling. During a leg cramp, stretch or massage the muscle that has the cramp. Heat may help decrease pain and muscle spasms.  Apply heat on your muscle for 20 to 30 minutes every 2 hours for as many days as directed.    · Back pain  may occur as your baby grows. Do not stand for long periods of time or lift heavy items. Use good posture while you stand, squat, or bend. Wear low-heeled shoes with good support. Rest may also help to relieve back pain. Ask your healthcare provider about exercises you can do to strengthen your back muscles.    Stay healthy during your pregnancy:   · Eat a variety of healthy foods.  Healthy foods include fruits, vegetables, whole-grain breads, low-fat dairy foods, beans, lean meats, and fish. Drink liquids as directed. Ask how much liquid to drink each day and which liquids are best for you. Limit caffeine to less than 200 milligrams each day. Limit your intake of fish to 2 servings each week. Choose fish low in mercury such as canned light tuna, shrimp, crab, salmon, cod, or tilapia. Do not  eat fish high in mercury such as swordfish, tilefish, king mackerel, and shark.     · Take prenatal vitamins as directed.  Your need for certain vitamins and minerals, such as folic acid, increases during pregnancy. Prenatal vitamins provide some of the extra vitamins and minerals you need. Prenatal vitamins may also help to decrease the risk of certain birth defects.     · Ask how much weight you should gain during your pregnancy.  Too much or too little weight gain can be unhealthy for you and your baby.     · Talk to your healthcare provider about exercise.  Moderate exercise can help you stay fit. Your healthcare provider will help you plan an exercise program that is safe for you during pregnancy.     · Do not smoke.  Smoking increases your risk of a miscarriage and other health problems during your pregnancy. Smoking can cause your baby to be born too early or weigh less at birth. Quit smoking as soon as you think you might be pregnant. Ask your healthcare provider for information if you need help quitting.    · Do  not drink alcohol.  Alcohol passes from your body to your baby through the placenta. It can affect your baby's brain development and cause fetal alcohol syndrome (FAS). FAS is a group of conditions that causes mental, behavior, and growth problems.     · Talk to your healthcare provider before you take any medicines.  Many medicines may harm your baby if you take them when you are pregnant. Do not take any medicines, vitamins, herbs, or supplements without first talking to your healthcare provider. Never use   illegal or street drugs (such as marijuana or cocaine) while you are pregnant.    Safety tips:   · Avoid hot tubs and saunas.  Do not use a hot tub or sauna while you are pregnant, especially during your first trimester. Hot tubs and saunas may raise your baby's temperature and increase the risk of birth defects.    · Avoid toxoplasmosis.  This is an infection caused by eating raw meat or being around infected cat feces. It can cause birth defects, miscarriages, and other problems. Wash your hands after you touch raw meat. Make sure any meat is well-cooked before you eat it. Avoid raw eggs and unpasteurized milk. Use gloves or ask someone else to clean your cat's litter box while you are pregnant.     · Ask your healthcare provider about travel.  The most comfortable time to travel is during the second trimester. Ask your healthcare provider if you can travel after 36 weeks. You may not be able to travel in an airplane after 36 weeks. He may also recommend that you avoid long road trips.    Follow up with your healthcare provider or obstetrician as directed:  Go to all of your prenatal visits during your pregnancy. Write down your questions so you remember to ask them during your visits.  © Copyright [employer] 2019 Information is for End User's use only and may not be sold, redistributed or otherwise used for commercial purposes. All illustrations and images included in CareNotes® are the copyrighted  property of A.D.A.M., Inc. or [employer]  The above information is an educational aid only. It is not intended as medical advice for individual conditions or treatments. Talk to your doctor, nurse or pharmacist before following any medical regimen to see if it is safe and effective for you.

## 2018-05-09 ENCOUNTER — Ambulatory Visit: Payer: Self-pay

## 2018-05-09 DIAGNOSIS — Z3490 Encounter for supervision of normal pregnancy, unspecified, unspecified trimester: Secondary | ICD-10-CM | POA: Insufficient documentation

## 2018-05-09 DIAGNOSIS — Z3491 Encounter for supervision of normal pregnancy, unspecified, first trimester: Secondary | ICD-10-CM
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
## 2018-05-09 NOTE — Progress Notes (Signed)
SW completed a chart review on the patient due to hx of self harm (cutting) and depression and psychiatric evaluation.  Patient had a telemed appointment today for a new patient intake.  SW will plan to see the patient at her new OB appointment which is scheduled on May 15, 2018.  SW to assist as needed in the meantime.  Oretha Ellis, MSW  Pager [phone]

## 2018-05-09 NOTE — Progress Notes (Signed)
See initial prenatal abstract for further documentation.     Jeffren Dombek, LPN]

## 2018-05-13 ENCOUNTER — Ambulatory Visit: Payer: Medicaid (Managed Care) | Attending: Obstetrics and Gynecology

## 2018-05-13 ENCOUNTER — Other Ambulatory Visit
Admission: RE | Admit: 2018-05-13 | Discharge: 2018-05-13 | Disposition: A | Discharge status: Home / Self Care | Payer: Medicaid (Managed Care) | Source: Ambulatory Visit | Attending: Obstetrics & Gynecology | Admitting: Obstetrics & Gynecology

## 2018-05-13 ENCOUNTER — Other Ambulatory Visit: Payer: Self-pay | Admitting: Obstetrics & Gynecology

## 2018-05-13 DIAGNOSIS — Z3687 Encounter for antenatal screening for uncertain dates: Secondary | ICD-10-CM | POA: Insufficient documentation

## 2018-05-13 DIAGNOSIS — Z3491 Encounter for supervision of normal pregnancy, unspecified, first trimester: Secondary | ICD-10-CM | POA: Insufficient documentation

## 2018-05-13 DIAGNOSIS — N926 Irregular menstruation, unspecified: Secondary | ICD-10-CM | POA: Insufficient documentation

## 2018-05-13 DIAGNOSIS — Z3682 Encounter for antenatal screening for nuchal translucency: Secondary | ICD-10-CM

## 2018-05-13 DIAGNOSIS — D573 Sickle-cell trait: Secondary | ICD-10-CM

## 2018-05-13 LAB — HEPATITIS C ANTIBODY: Hep C Ab: NEGATIVE

## 2018-05-13 LAB — TYPE AND SCREEN FOR PNP
ABO RH Blood Type: O POS
Antibody Screen: NEGATIVE

## 2018-05-13 LAB — HIV 1&2 ANTIGEN/ANTIBODY: HIV 1&2 ANTIGEN/ANTIBODY: NONREACTIVE

## 2018-05-13 NOTE — Progress Notes (Signed)
[facility] Note    Nichole Carter was seen today for her dating US/NT scan. She has a NOB scheduled for 5/6. After discussing genetic screening with Ike Bene DNP at her [facility] telemed visit, Nichole Carter was interested in genetic screening.     Today, we reviewed the FTS again, as a screening tool for T21 and T18. We also discussed the routine initial prenatal labs, including CF screening and SMA screening. She denies a family history of neuromuscular disease. She does desire SMA screening, and is aware that it may not be covered by her insurance (Medicaid) and the cost ay be $600-800 out of pocket.     Consents signed and labs ordered.     She has a history of sickle cell trait and desires referral to genetic counseling, which was placed.     She will follow up at her NOB on 5/6.     Doneta Public MD  [facility]    05/13/18  12:07 PM

## 2018-05-14 LAB — LEAD VENOUS: Lead,Venous: <1 ug/dl (ref 0–5)

## 2018-05-14 LAB — PRENATAL PROFILE
Baso # K/uL: 0 10*3/uL (ref 0.0–0.1)
Basophil %: 0.2 %
Eos # K/uL: 0 10*3/uL (ref 0.0–0.4)
Eosinophil %: 0.4 %
HBV S Ag: NEGATIVE
Hematocrit: 34 % (ref 34–45)
Hemoglobin: 11.5 g/dL (ref 11.2–15.7)
IMM Granulocytes #: 0 10*3/uL (ref 0.0–0.0)
IMM Granulocytes: 0.3 %
Lymph # K/uL: 1.9 10*3/uL (ref 1.2–3.7)
Lymphocyte %: 20.8 %
MCH: 29 pg (ref 26–32)
MCHC: 34 g/dL (ref 32–36)
MCV: 85 fL (ref 79–95)
Mono # K/uL: 0.4 10*3/uL (ref 0.2–0.9)
Monocyte %: 3.7 %
Neut # K/uL: 7 10*3/uL — ABNORMAL HIGH (ref 1.6–6.1)
Nucl RBC # K/uL: 0 10*3/uL (ref 0.0–0.0)
Nucl RBC %: 0 /100 WBC (ref 0.0–0.2)
Platelets: 258 10*3/uL (ref 160–370)
RBC: 4 MIL/uL (ref 3.9–5.2)
RDW: 13.7 % (ref 11.7–14.4)
Rubella IgG AB: POSITIVE
Seg Neut %: 74.6 %
Syphilis Screen: NEGATIVE
Syphilis Status: NONREACTIVE
WBC: 9.3 10*3/uL (ref 4.0–10.0)

## 2018-05-14 LAB — VARICELLA ZOSTER IGG AB: VZV IgG: POSITIVE

## 2018-05-14 LAB — LEAD, BLOOD

## 2018-05-15 ENCOUNTER — Ambulatory Visit: Payer: Medicaid (Managed Care) | Attending: Surgery | Admitting: Surgery

## 2018-05-15 VITALS — BP 118/75 | HR 84 | Temp 98.8°F | Ht 67.0 in | Wt 130.2 lb

## 2018-05-15 DIAGNOSIS — O26891 Other specified pregnancy related conditions, first trimester: Secondary | ICD-10-CM | POA: Insufficient documentation

## 2018-05-15 DIAGNOSIS — Z113 Encounter for screening for infections with a predominantly sexual mode of transmission: Secondary | ICD-10-CM

## 2018-05-15 DIAGNOSIS — Q208 Other congenital malformations of cardiac chambers and connections: Secondary | ICD-10-CM

## 2018-05-15 DIAGNOSIS — Z3491 Encounter for supervision of normal pregnancy, unspecified, first trimester: Secondary | ICD-10-CM | POA: Insufficient documentation

## 2018-05-15 DIAGNOSIS — E049 Nontoxic goiter, unspecified: Secondary | ICD-10-CM | POA: Insufficient documentation

## 2018-05-15 DIAGNOSIS — O0971 Supervision of high risk pregnancy due to social problems, first trimester: Secondary | ICD-10-CM

## 2018-05-15 DIAGNOSIS — Z9189 Other specified personal risk factors, not elsewhere classified: Secondary | ICD-10-CM | POA: Insufficient documentation

## 2018-05-15 DIAGNOSIS — D573 Sickle-cell trait: Secondary | ICD-10-CM | POA: Insufficient documentation

## 2018-05-15 DIAGNOSIS — R109 Unspecified abdominal pain: Secondary | ICD-10-CM

## 2018-05-15 LAB — MATERNAL 1ST TRIMESTER SCR (11-13 6/7 WEEKS)
Age at Delivery: 23.2 years
CRL: 58.8 mm
DS A Priori Risk: 1:1040 {titer}
HCG MoM: 1.37
NT MoM: 0.93
NT: 1.3 mm
PAPP-A MoM: 0.49
PAPP-A: 589 ng/mL
Patient Weight: 135 [lb_av]
T18 A Priori Risk: 1:2080 {titer}
hCG: 139683 m[IU]/mL

## 2018-05-15 LAB — DRUG SCREEN CHEMICAL DEPENDENCY, URINE
Amphetamine,UR: NEGATIVE
Benzodiazepinen,UR: NEGATIVE
Cocaine/Metab,UR: NEGATIVE
Opiates,UR: NEGATIVE
THC Metabolite,UR: NEGATIVE
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
## 2018-05-15 NOTE — Patient Instructions (Addendum)
Back exercises:   Stretch back out like a cat 5 times twice daily.   Get on hands and knees on the floor by your bed or the couch
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
## 2018-05-16 ENCOUNTER — Telehealth: Payer: Self-pay

## 2018-05-16 LAB — AEROBIC CULTURE: Aerobic Culture: 0

## 2018-05-16 LAB — TRICHOMONAS DNA AMPLIFICATION: Trichomonas DNA amplification: 0

## 2018-05-16 LAB — N. GONORRHOEAE DNA AMPLIFICATION: N. gonorrhoeae DNA Amplification: 0

## 2018-05-16 LAB — CHLAMYDIA PLASMID DNA AMPLIFICATION: Chlamydia Plasmid DNA Amplification: 0

## 2018-05-16 NOTE — Telephone Encounter (Signed)
Received call from [facility] with [facility] Exception Handling calling to report the spinal muscular atrophy DNA test was unable to be added on. She states the reason why is it is supposed to be in a lavender tube at room temperature and [place] [facility] does not do this. Will inform provider.    Jessie Foot, RN

## 2018-05-16 NOTE — Telephone Encounter (Signed)
A user error has taken place: encounter opened in error, closed for administrative reasons.    Nichole Carter Nichole Bernadette Armijo, RN

## 2018-05-17 ENCOUNTER — Ambulatory Visit: Payer: Medicaid (Managed Care)

## 2018-05-17 DIAGNOSIS — D573 Sickle-cell trait: Secondary | ICD-10-CM

## 2018-05-17 DIAGNOSIS — Z3A13 13 weeks gestation of pregnancy: Secondary | ICD-10-CM

## 2018-05-17 LAB — GYN CYTOLOGY

## 2018-05-17 NOTE — Progress Notes (Signed)
Telephone Visit     This is a new patient visit.    Consent was previously obtained from the patient to complete this telephone consult; including the potential for financial liability.    21+ minutes were spent on the phone with the patient, patient representatives, and/or other attendees.     Armine Bellows was seen for counseling on 05/17/18.  A full summary of our meeting is available under the Letters tab.     If we can be of any further assistance please feel free to contact us by calling us at [phone]      Mickel Crow, MS, [facility]  Certified Genetic Counselor  [facility]  [phone] phone  [phone] fax    MFM Provider:

## 2018-05-21 ENCOUNTER — Ambulatory Visit
Admission: RE | Admit: 2018-05-21 | Discharge: 2018-05-21 | Disposition: A | Discharge status: Home / Self Care | Payer: Medicaid (Managed Care) | Source: Ambulatory Visit | Attending: Cardiology | Admitting: Cardiology

## 2018-05-21 DIAGNOSIS — Z3491 Encounter for supervision of normal pregnancy, unspecified, first trimester: Secondary | ICD-10-CM

## 2018-05-21 DIAGNOSIS — Q208 Other congenital malformations of cardiac chambers and connections: Secondary | ICD-10-CM | POA: Insufficient documentation

## 2018-05-21 LAB — ECHO COMPLETE
Aortic Arch Diameter: 2 cm
Aortic Diameter (mid tubular): 2.2 cm
Aortic Diameter (sinus of Valsalva): 2.4 cm
BMI: 19.6 kg/m2
BP Diastolic: 55 mmHg
BP Systolic: 105 mmHg
BSA: 1.64 m2
Deceleration Time - MV: 104 ms
E/A ratio: 2.84
EF: 65 %
Heart Rate: 82 {beats}/min
Height: 67 in
IVC Diameter: 1.5 cm
LA Diameter BSA Index: 1.6 cm/m2
LA Diameter Height Index: 1.6 cm/m
LA Diameter: 2.7 cm
LA Systolic Vol BSA Index: 25.8 mL/m2
LA Systolic Vol Height Index: 24.9 mL/m
LA Systolic Volume: 42.3 mL
LV ASE Mass BSA Index: 64.2 gm/m2
LV ASE Mass Height 2.7 Index: 25.1 gm/m2.7
LV ASE Mass Height Index: 61.9 gm/m
LV ASE Mass: 105.3 gm
LV Posterior Wall Thickness: 0.8 cm
LV Septal Thickness: 0.8 cm
LVED Diameter BSA Index: 2.6 cm/m2
LVED Diameter Height Index: 2.5 cm/m
LVED Diameter: 4.3 cm
LVES Diameter BSA Index: 1.6 cm/m2
LVES Diameter Height Index: 1.6 cm/m
LVES Diameter: 2.7 cm
LVOT Area (calculated): 3.05 cm2
LVOT Cardiac Index: 3.75 L/min/m2
LVOT Cardiac Output: 6.15 L/min
LVOT Diameter: 1.97 cm
LVOT PWD VTI: 24.6 cm
LVOT PWD Velocity (mean): 89.8 cm/s
LVOT PWD Velocity (peak): 128.3 cm/s
LVOT SV BSA Index: 45.7 mL/m2
LVOT SV Height Index: 44 mL/m
LVOT Stroke Rate (mean): 273.6 mL/s
LVOT Stroke Rate (peak): 390.9 mL/s
LVOT Stroke Volume: 74.94 cc
MV Peak A Velocity: 44.4 cm/s
MV Peak E Velocity: 125.9 cm/s
Mitral Annular E/Ea Vel Ratio: 10.27
Mitral Annular Ea Velocity: 12.26 cm/s
Peak Gradient - TR: 20.1 mmHg
Peak Velocity - TR: 224.18 cm/s
RA Pressure Estimate: 5 mmHg
RA Volume BSA Index: 14 mL/m2
RA Volume Height Index: 13.5 mL/m
RA Volume: 23 mL
RR Interval: 731.71 ms
RV Peak Systolic Pressure: 25.1 mmHg
RVED Diameter BSA Index: 2.1 cm/m2
RVED Diameter Height Index: 2.1 cm/m
RVED Diameter: 3.51 cm
Tricuspid Annular Systolic Plane Excursion (TAPSE): 2.1 cm
Weight (lbs): 125 [lb_av]
Weight: 2000 oz

## 2018-05-21 LAB — RESOLUTION

## 2018-05-21 NOTE — Progress Notes (Signed)
Social Work  Contacts:  NFP  Summary/Plan:  Margretta Sidle NFP has been assigned to Gin.  Glenna Fellows, LCSWR

## 2018-05-22 LAB — CYSTIC FIBROSIS 106-MUTATION PANEL: Result Summary, CFP: NEGATIVE

## 2018-05-24 LAB — SPINAL MUSCULAR ATROPHY DNA

## 2018-06-11 ENCOUNTER — Encounter: Payer: Self-pay | Admitting: Obstetrics and Gynecology

## 2018-06-12 ENCOUNTER — Ambulatory Visit: Payer: Medicaid (Managed Care) | Admitting: Obstetrics and Gynecology

## 2018-06-12 VITALS — BP 112/68 | HR 86 | Ht 67.01 in | Wt 132.6 lb

## 2018-06-12 DIAGNOSIS — D573 Sickle-cell trait: Secondary | ICD-10-CM

## 2018-06-12 DIAGNOSIS — Z3A16 16 weeks gestation of pregnancy: Secondary | ICD-10-CM

## 2018-06-12 DIAGNOSIS — Z3492 Encounter for supervision of normal pregnancy, unspecified, second trimester: Secondary | ICD-10-CM

## 2018-06-12 NOTE — Progress Notes (Signed)
[facility]    OB Check at [redacted]w[redacted]d     SUBJECTIVE  She reports doing well. She has more of an appetite now. She is working as a Water quality scientist at [employer]. Feels safe at home currently.     Wonders if it is normal to feel an irregular, occasional lower abdominal "pressure." Not truly painful. Never felt she needed to take medication.    Reports no fetal movement yet. Denies contractions, leakage of fluids, vaginal bleeding.     Problem list, vitals, family & social history, medications, and allergies were reviewed and updated as appropriate today.    OBJECTIVE  Vitals:    06/12/18 1609   BP: 112/68   Pulse: 86   Weight: 60.1 kg (132 lb 9.6 oz)   Height: 1.702 m (5' 7.01")     Body mass index is 20.76 kg/m.   Total weight gain: 1.179 kg (2 lb 9.6 oz)     Fetal Heart Rate: 160      Physical Exam  General: AAOx3 in NAD  Pulm: Normal respiratory effort  Abd: Non-tender, gravid            ASSESSMENT & PLAN  This is a 22 y.o. G2P0010 woman at [redacted]w[redacted]d gestational age, here for prenatal care.     Problem List Items Addressed This Visit     Prenatal care     - Reassured her that occasional lower abdominal pressure/fullness is not unusual and without associated concerning symptoms, is not cause for alarm. Provided education on normal symptoms of pregnancy, worrisome symptoms that would be a good reason to call us.   - Discussed anatomic USN to be scheduled in 3 weeks.          Relevant Orders    OB ultrasound scan    Sickle cell trait      Other Visit Diagnoses     [redacted] weeks gestation of pregnancy    -  Primary    Relevant Orders    OB ultrasound scan        Next OBC: 3 weeks    Clint Lipps, M.D.  Signed 06/12/2018 at 4:29 PM

## 2018-06-12 NOTE — Assessment & Plan Note (Addendum)
-   Reassured her that occasional lower abdominal pressure/fullness is not unusual and without associated concerning symptoms, is not cause for alarm. Provided education on normal symptoms of pregnancy, worrisome symptoms that would be a good reason to call us.   - Discussed anatomic USN to be scheduled in 3 weeks.

## 2018-07-03 ENCOUNTER — Ambulatory Visit: Payer: Medicaid (Managed Care) | Admitting: Obstetrics & Gynecology

## 2018-07-03 NOTE — Progress Notes (Signed)
SW intended to meet with the patient following her OB appointment but the patient did not show for her appointment.  SW to attempt to see her at her next OB visit.    Rayann Heman, MSW  Pager [phone]

## 2018-07-05 ENCOUNTER — Ambulatory Visit: Payer: Medicaid (Managed Care) | Attending: Obstetrics and Gynecology

## 2018-07-05 DIAGNOSIS — Z3492 Encounter for supervision of normal pregnancy, unspecified, second trimester: Secondary | ICD-10-CM | POA: Insufficient documentation

## 2018-07-05 DIAGNOSIS — Z3A16 16 weeks gestation of pregnancy: Secondary | ICD-10-CM

## 2018-07-05 DIAGNOSIS — Z363 Encounter for antenatal screening for malformations: Secondary | ICD-10-CM

## 2018-07-10 ENCOUNTER — Telehealth: Payer: Self-pay

## 2018-07-10 NOTE — Telephone Encounter (Signed)
She is having a boy!    Did they not tell her at her ultrasound?    Thanks!    Lavonne Chick, MD

## 2018-07-10 NOTE — Telephone Encounter (Signed)
TC to the pt. Informed her she is having a boy. Pt very appreciative and excited.    Eula Fried, RN

## 2018-07-10 NOTE — Telephone Encounter (Signed)
Call received from the pt. She is calling for results of the anatomy scan. She wants to know what she is having. On the ultrasound report it says that the genitalia is "seen." Will consult with provider.    Eula Fried, RN

## 2018-07-16 ENCOUNTER — Ambulatory Visit: Payer: Medicaid (Managed Care) | Admitting: Obstetrics & Gynecology

## 2018-07-16 DIAGNOSIS — O99342 Other mental disorders complicating pregnancy, second trimester: Secondary | ICD-10-CM

## 2018-07-16 DIAGNOSIS — F32A Depression, unspecified: Secondary | ICD-10-CM

## 2018-07-16 DIAGNOSIS — Z3492 Encounter for supervision of normal pregnancy, unspecified, second trimester: Secondary | ICD-10-CM

## 2018-07-16 NOTE — Progress Notes (Signed)
[facility]    OB Check at [redacted]w[redacted]d     SUBJECTIVE  She reports feeling well. Reports normal, active fetal movement and denies contractions, leakage of fluids, vaginal bleeding. Had anatomic [place] and was complete, normal.    Problem list, vitals, family & social history, medications, and allergies were reviewed and updated as appropriate today.    OBJECTIVE  Vitals:    07/16/18 1327   BP: 110/70   Pulse: 83   Weight: 60.1 kg (132 lb 9.6 oz)   Height: 1.702 m (5' 7.01")     Body mass index is 20.76 kg/m.   Total weight gain: 1.179 kg (2 lb 9.6 oz)     Fetal Heart Rate: 145     Physical Exam  General: AAOx3 in NAD  Pulm: Normal respiratory effort  Abd: Non-tender, gravid. Fundus at umbilicus      ASSESSMENT & PLAN  This is a 23 y.o. G2P0010 woman at [redacted]w[redacted]d gestational age, here for prenatal care.     Problem List Items Addressed This Visit        Urinary    Prenatal care     -reviewed third tri labs next visit  -reviewed normal and complete anatomic  -will need TDap next visit  -seen by SW today  -TSH was ordered by not done with initial labs--thyroid today without significant thyromegaly so will defer at this time.                   Return to clinic in 5-6 weeks    Benjamine Mola MD  [facility]    07/16/18  2:20 PM

## 2018-07-16 NOTE — Assessment & Plan Note (Signed)
-  reviewed third tri labs next visit  -reviewed normal and complete anatomic  -will need TDap next visit  -seen by SW today  -TSH was ordered by not done with initial labs--thyroid today without significant thyromegaly so will defer at this time.
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
## 2018-07-30 ENCOUNTER — Telehealth: Payer: Self-pay
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
## 2018-07-30 NOTE — Telephone Encounter (Signed)
Writer left a voicemail regarding referral for counseling at [facility]. Writer provided direct phone number and office hours.

## 2018-08-16 ENCOUNTER — Ambulatory Visit: Payer: Medicaid (Managed Care)

## 2018-08-20 ENCOUNTER — Encounter: Payer: Medicaid (Managed Care) | Admitting: Women's Health

## 2018-08-20 NOTE — Progress Notes (Signed)
SW intended to meet with the patient but the patient did not show for her appointment.  SW to attempt to meet with her following her next scheduled visit.  Rayann Heman, MSW  Pager [phone]

## 2018-08-20 NOTE — Progress Notes (Deleted)
[facility] Women[facility] Obstetrical Visit  Location: [place] is an 23 y.o. G2P0010 being seen today for her obstetrical visit at [redacted]w[redacted]d.        SUBJECTIVE  She reports ***. Reports {gen normal-decreased-absent ed:313321::"normal, active"} fetal movement and denies contractions, leakage of fluids, vaginal bleeding.     Problem list, vitals, family & social history, medications, and allergies were reviewed and updated as appropriate today.    OBJECTIVE  There were no vitals filed for this visit.  There is no height or weight on file to calculate BMI.   Total weight gain: 1.179 kg (2 lb 9.6 oz)           Physical Exam  General: AAOx3 in NAD  Pulm: Normal respiratory effort  Abd: Non-tender, gravid  LE: No edema bilaterally. Symmetric, non-tender lower extremities.    {Pelvic exam: }    ASSESSMENT & PLAN  This is a 23 y.o. G2P0010 woman at 104w5d gestational age, here for prenatal care.     ORDERED TESTING:  No orders of the defined types were placed in this encounter.       Diagnoses include:  No diagnosis found.     Problem list reviewed and updated.  Problem List Items Addressed This Visit     None            1hr gtt, CBC, and RPR *** today.  TDaP offered and pt ***  PTL s/sx and FM reviewed.  Pt understands and agrees with plan of care - denies further needs or concerns at this time and all questions answered.    Dispo:  She will follow-up in ***    *** min face to face time spent with the patient; >50% of this time was spent on counseling and coordination of care.    Audie Box, NP  08/20/2018  9:49 AM

## 2018-08-25 NOTE — Progress Notes (Deleted)
[facility]    Subjective:     Nichole Carter is a 23 y.C.V8L3810 being seen today for her obstetrical visit at [redacted]w[redacted]d.  Patient reports ***.      Needs Tdap    She has orders in place for 1 hr GTT, Hep B, RPR and CBC, expires 10/19/18. These are pended though.   Give her the Glucola.     PP contrac  Gender  Circ  Feed  Peds          {Common ambulatory SmartLinks:19316::"Patient's medications, allergies, past medical, surgical, social and family histories were reviewed and updated as appropriate."}    Pregnancy complicated by:   Patient Active Problem List   Diagnosis Code    Shoulder pain M25.519    Anemia D64.9    Atypical chest pain R07.89    Iron deficiency E61.1    Sickle cell trait D57.3    Insomnia G47.00    Deliberate self-cutting Z72.89    Depression F32.9    Grief F43.21    Menorrhagia N92.0    Avitaminosis D E55.9    Disorder of right ventricle of heart I51.9    Prenatal care Z34.90    Supervision of high risk pregnancy due to social problems O09.70       Review of Systems:  {ros; complete:30496}     Objective:        There is no height or weight on file to calculate BMI.   Total weight gain: 1.179 kg (2 lb 9.6 oz)    FHT:     Uterine Size:       Abdomen: Soft/gravid/nt          Assessment:     23 y.o.G2P0010 at [redacted]w[redacted]d who presents for routine OB visit.    Plan:   ORDERED TESTING:  No orders of the defined types were placed in this encounter.       Diagnoses include:  1. [redacted] weeks gestation of pregnancy     2. Prenatal care in third trimester          Problem list reviewed and updated.  No problems updated.    1hr gtt and CBC today.  TDaP offered and pt ***  PTL s/sx and FM reviewed.  Pt understands and agrees with plan of care - denies further needs or concerns at this time and all questions answered.    Dispo:  She will follow-up in 2 weeks.    15 min face to face time spent with the patient; >50% of this time was spent on counseling and coordination of care.    Sharmon Revere, NP  08/25/2018  1:53 PM

## 2018-08-26 ENCOUNTER — Encounter: Payer: Medicaid (Managed Care) | Admitting: Obstetrics and Gynecology

## 2018-08-28 ENCOUNTER — Other Ambulatory Visit
Admission: RE | Admit: 2018-08-28 | Discharge: 2018-08-28 | Disposition: A | Discharge status: Home / Self Care | Payer: Medicaid (Managed Care) | Source: Ambulatory Visit | Attending: Obstetrics and Gynecology | Admitting: Obstetrics and Gynecology

## 2018-08-28 ENCOUNTER — Ambulatory Visit: Payer: Medicaid (Managed Care) | Admitting: Obstetrics and Gynecology

## 2018-08-28 VITALS — BP 104/62 | HR 109 | Ht 67.01 in | Wt 131.4 lb

## 2018-08-28 DIAGNOSIS — Z3492 Encounter for supervision of normal pregnancy, unspecified, second trimester: Secondary | ICD-10-CM

## 2018-08-28 DIAGNOSIS — Z3A27 27 weeks gestation of pregnancy: Secondary | ICD-10-CM

## 2018-08-28 DIAGNOSIS — Z23 Encounter for immunization: Secondary | ICD-10-CM | POA: Insufficient documentation

## 2018-08-28 DIAGNOSIS — D573 Sickle-cell trait: Secondary | ICD-10-CM

## 2018-08-28 LAB — CBC
Hematocrit: 34 % (ref 34–45)
Hemoglobin: 11.3 g/dL (ref 11.2–15.7)
MCH: 29 pg (ref 26–32)
MCHC: 33 g/dL (ref 32–36)
MCV: 88 fL (ref 79–95)
Platelets: 226 10*3/uL (ref 160–370)
RBC: 3.9 MIL/uL (ref 3.9–5.2)
RDW: 13.1 % (ref 11.7–14.4)
WBC: 9.5 10*3/uL (ref 4.0–10.0)

## 2018-08-28 LAB — GLUCOSE TOLERANCE, 1 HOUR: Glucose,50gm 1HR: 121 mg/dL (ref 63–135)

## 2018-08-28 NOTE — Assessment & Plan Note (Addendum)
-   TDaP today  - GTT/CBC ordered  - Discussed how much someone is "showing" can depend a lot on body habitus, abdominal musculature. Reassured her that uterus is measuring within 2 cm of EGA, so WNL. Discussed watching FH and getting USN if not increasing appropriately.   - Reviewed OK to take Tums when pregnant. Advised her to call if this is not adequate and will send Rx for Pepcid or PPI.

## 2018-08-28 NOTE — Progress Notes (Signed)
SW unable to see the patient as she left the office prior to being able to do so.  SW to plan to see the patient at her next OB visit.  Rayann Heman, MSW  Pager [phone]

## 2018-08-28 NOTE — Progress Notes (Signed)
Nichole Carter    OB Check at [redacted]w[redacted]d     SUBJECTIVE  She reports feeling well, has no concerns. Is dealing with some heartburn occasionally. Reports normal, active fetal movement and denies contractions, leakage of fluids, vaginal bleeding.     Wonders if it's okay if she's not really showing yet.     Problem list, vitals, family & social history, medications, and allergies were reviewed and updated as appropriate today.    OBJECTIVE  Vitals:    08/28/18 1025   BP: 104/62   Pulse: 109   Weight: 59.6 kg (131 lb 6.4 oz)   Height: 1.702 m (5' 7.01")     Body mass index is 20.58 kg/m.   Total weight gain: 0.635 kg (1 lb 6.4 oz)  Fundal Height (cm): 27 cm  Fetal Heart Rate: 145        Physical Exam  General: AAOx3 in NAD  Pulm: Normal respiratory effort  Abd: Non-tender, gravid            ASSESSMENT & PLAN  This is a 23 y.o. G2P0010 woman at [redacted]w[redacted]d gestational age, here for prenatal care.     Problem List Items Addressed This Visit     Prenatal care     - TDaP today  - GTT/CBC ordered  - Discussed how much someone is "showing" can depend a lot on body habitus, abdominal musculature. Reassured her that uterus is measuring within 2 cm of EGA, so WNL. Discussed watching FH and getting USN if not increasing appropriately.   - Reviewed OK to take Tums when pregnant. Advised her to call if this is not adequate and will send Rx for Pepcid or PPI.         Relevant Orders    Aerobic culture (Urine voided)    CBC    Glucose tolerance, 1 hour    Syphilis Screen w Rfx to RPR and TPPA    Sickle cell trait     - Urine collected collected for 3rd trimester          Relevant Orders    Aerobic culture (Urine voided)      Other Visit Diagnoses     [redacted] weeks gestation of pregnancy    -  Primary    Relevant Orders    Aerobic culture (Urine voided)    CBC    Glucose tolerance, 1 hour    Syphilis Screen w Rfx to RPR and TPPA    Need for Tdap vaccination        Relevant Orders    Tdap >/=76yr(BOOSTRIX/ADACEL) vaccine IM (AMBULATORY USE  ONLY) (Completed)        Next OBC: 2 weeks    Everitt Amber, M.D.  Signed 08/28/2018 at 11:01 AM

## 2018-08-28 NOTE — Assessment & Plan Note (Signed)
-   Urine collected collected for 3rd trimester

## 2018-08-29 LAB — SYPHILIS SCREEN
Syphilis Screen: NEGATIVE
Syphilis Status: NONREACTIVE

## 2018-08-30 LAB — AEROBIC CULTURE: Aerobic Culture: 0

## 2018-09-02 NOTE — Progress Notes (Signed)
This encounter was created in error - please disregard.

## 2018-09-17 ENCOUNTER — Telehealth: Payer: Self-pay

## 2018-09-17 NOTE — Telephone Encounter (Signed)
Call received from the pt. She states one of her friends just had a paterntiy test done and is pregnant. She is requesting if we do these and would like this ordered. Will route to provider.    Eula Fried, RN

## 2018-09-17 NOTE — Telephone Encounter (Signed)
Call to the pt. No answer. Left message on the pt's voice mail to call the nurses' line back, phone number provided.    Timi Reeser Elisabeth Janiel Derhammer, RN

## 2018-09-17 NOTE — Telephone Encounter (Signed)
Pt returns call to office. Reviewed the only paternity test that can be while pregnant is an amniocentesis. Reviewed she can get this set up with the pediatric team after she delivers. Pt expresses understanding and is agreeable.    Eula Fried, RN

## 2018-09-17 NOTE — Telephone Encounter (Signed)
The only thing that can be done while she is pregnant would require an amniocentesis. This is certainly something the Pediatric team can help her figure out after she delivers!    Lavonne Chick, MD

## 2018-09-18 ENCOUNTER — Ambulatory Visit: Payer: Medicaid (Managed Care) | Admitting: Surgery

## 2018-09-18 VITALS — BP 116/60 | HR 79 | Temp 97.1°F | Ht 67.0 in | Wt 135.4 lb

## 2018-09-18 DIAGNOSIS — O26843 Uterine size-date discrepancy, third trimester: Secondary | ICD-10-CM

## 2018-09-18 DIAGNOSIS — Z3493 Encounter for supervision of normal pregnancy, unspecified, third trimester: Secondary | ICD-10-CM

## 2018-09-18 DIAGNOSIS — O0973 Supervision of high risk pregnancy due to social problems, third trimester: Secondary | ICD-10-CM
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
## 2018-09-18 NOTE — Progress Notes (Signed)
SW intended to meet with the patient but the patient left prior to being able to do so.  SW to plan to see her at her next visit to check in.    Rayann Heman, MSW  Pager [phone]

## 2018-09-24 ENCOUNTER — Other Ambulatory Visit: Payer: Self-pay

## 2018-09-24 ENCOUNTER — Other Ambulatory Visit: Payer: Self-pay | Admitting: Surgery

## 2018-09-24 ENCOUNTER — Ambulatory Visit: Payer: Medicaid (Managed Care) | Attending: Surgery

## 2018-09-24 DIAGNOSIS — O0973 Supervision of high risk pregnancy due to social problems, third trimester: Secondary | ICD-10-CM

## 2018-09-24 DIAGNOSIS — Z3490 Encounter for supervision of normal pregnancy, unspecified, unspecified trimester: Secondary | ICD-10-CM

## 2018-09-24 DIAGNOSIS — O26843 Uterine size-date discrepancy, third trimester: Secondary | ICD-10-CM | POA: Insufficient documentation

## 2018-09-24 DIAGNOSIS — Z3493 Encounter for supervision of normal pregnancy, unspecified, third trimester: Secondary | ICD-10-CM

## 2018-10-02 ENCOUNTER — Ambulatory Visit: Payer: Medicaid (Managed Care) | Admitting: Obstetrics and Gynecology

## 2018-10-02 ENCOUNTER — Ambulatory Visit: Payer: Medicaid (Managed Care)

## 2018-10-02 ENCOUNTER — Ambulatory Visit: Payer: Medicaid (Managed Care) | Attending: Surgery

## 2018-10-02 VITALS — BP 108/64 | HR 79 | Ht 67.01 in | Wt 141.4 lb

## 2018-10-02 DIAGNOSIS — Z3A32 32 weeks gestation of pregnancy: Secondary | ICD-10-CM

## 2018-10-02 DIAGNOSIS — Z3493 Encounter for supervision of normal pregnancy, unspecified, third trimester: Secondary | ICD-10-CM

## 2018-10-02 DIAGNOSIS — Z23 Encounter for immunization: Secondary | ICD-10-CM

## 2018-10-02 DIAGNOSIS — O26843 Uterine size-date discrepancy, third trimester: Secondary | ICD-10-CM | POA: Insufficient documentation

## 2018-10-02 DIAGNOSIS — IMO0002 Reserved for concepts with insufficient information to code with codable children: Secondary | ICD-10-CM | POA: Insufficient documentation

## 2018-10-02 DIAGNOSIS — Z3490 Encounter for supervision of normal pregnancy, unspecified, unspecified trimester: Secondary | ICD-10-CM

## 2018-10-02 DIAGNOSIS — O2613 Low weight gain in pregnancy, third trimester: Secondary | ICD-10-CM

## 2018-10-02 DIAGNOSIS — O2610 Low weight gain in pregnancy, unspecified trimester: Secondary | ICD-10-CM | POA: Insufficient documentation

## 2018-10-02 NOTE — Assessment & Plan Note (Signed)
-   Flu vaccine given today  - Reviewed otherwise UTD on routine [facility]

## 2018-10-02 NOTE — Assessment & Plan Note (Signed)
-   Will await formal USN read from today, but per patient WNL  - Continue already ordered weekly NSTs and repeat USN for G&F next week

## 2018-10-02 NOTE — Assessment & Plan Note (Signed)
-   Encouraged continued use of supplemental beverages, calorie-dense but nutritious foods as desired

## 2018-10-02 NOTE — Progress Notes (Signed)
Nichole Carter    OB Check at [redacted]w[redacted]d     SUBJECTIVE  She reports feeling well today. Came from USN & NST, which were both normal per patient. Reports normal, active fetal movement and denies contractions, leakage of fluids, vaginal bleeding.     Mentions that she passed out yesterday after doing some jumping jacks and then going to a different room to "get air." She knows she is not drinking enough.     Problem list, vitals, family & social history, medications, and allergies were reviewed and updated as appropriate today.    OBJECTIVE  Vitals:    10/02/18 1344   BP: 108/64   Pulse: 79   Weight: 64.1 kg (141 lb 6.4 oz)   Height: 1.702 m (5' 7.01")     Body mass index is 22.14 kg/m.   Total weight gain: 5.171 kg (11 lb 6.4 oz)     Fetal Heart Rate: Per USN/NST        Physical Exam  General: AAOx3 in NAD  Pulm: Normal respiratory effort  Abd: Non-tender, gravid  Lower Extremity Edema: None             ASSESSMENT & PLAN  This is a 23 y.o. G2P0010 woman at [redacted]w[redacted]d gestational age, here for prenatal care.     Problem List Items Addressed This Visit     Prenatal care     - Flu vaccine given today  - Reviewed otherwise UTD on routine [facility]         Relevant Orders    Flu vaccine quadrivalent greater than or equal to 3yo preservative free IM (Completed)    Poor fetal growth     - Will await formal USN read from today, but per patient WNL  - Continue already ordered weekly NSTs and repeat USN for G&F next week          Insufficient weight gain during pregnancy     - Encouraged continued use of supplemental beverages, calorie-dense but nutritious foods as desired            Other Visit Diagnoses     [redacted] weeks gestation of pregnancy    -  Primary    Relevant Orders    Flu vaccine quadrivalent greater than or equal to 3yo preservative free IM (Completed)        - Discussed importance of hydration in pregnancy. Discussed increasing hydration and choosing salty snack with water or an electrolyte drink to help with fluid  retention. Advised her to let us know if this continues, in which case, we might need to have her see Cardiology again if it is a recurrent problem.     Next OBC: 1 week with NST & USN (already scheduled)    Everitt Amber, M.D.  Signed 10/02/2018 at 2:11 PM

## 2018-10-02 NOTE — Procedures (Addendum)
Fetal Non-Stress Test  Patient: Nichole Carter Age: 23 y.o.  Date of Birth: 12-26-1995  [id number]  MRN: [id number]  GA: [redacted]w[redacted]d    Reason for exam: Size less than dates     Maternal   Heart Rate: 82 (10/02/18 1213)  BP: 100/62 (10/02/18 1213)    Education Done:   External fetal monitoring: yes   Fetal kick counts: yes   NST: yes   VAS: no   Manual stimulation:no    Other     Fetal non-stress test for singleton pregnancy  Pre Procedure Diagnosis: Pregnancy  Post Procedure Diagnosis: Pregnancy            NST Start Time:  10/02/2018 12:10 PM            Uterine Irritability: Yes            Contractions: rare    NST Fetus A  Variability: moderate  FHR Baseline: 135  Decelerations: none   Accelerations: at least 15 BPM for 15 seconds  Stimulation: none  NST Interpretation: reactive    End Time:  10/02/2018 12:30 PM          Duration of test (min): 20  Location of NST Fetal Heart Tracing: Archived electronically        Test performed By: Philipp Ovens, RN  10/02/2018 12:14 PM     Marisa Severin, MD  Maternal Fetal Medicine  [phone]
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
## 2018-10-09 ENCOUNTER — Ambulatory Visit: Payer: Medicaid (Managed Care) | Attending: Surgery

## 2018-10-09 ENCOUNTER — Ambulatory Visit: Payer: Medicaid (Managed Care)

## 2018-10-09 DIAGNOSIS — IMO0002 Reserved for concepts with insufficient information to code with codable children: Secondary | ICD-10-CM

## 2018-10-09 DIAGNOSIS — O2613 Low weight gain in pregnancy, third trimester: Secondary | ICD-10-CM

## 2018-10-09 DIAGNOSIS — Z3490 Encounter for supervision of normal pregnancy, unspecified, unspecified trimester: Secondary | ICD-10-CM

## 2018-10-09 DIAGNOSIS — O365930 Maternal care for other known or suspected poor fetal growth, third trimester, not applicable or unspecified: Secondary | ICD-10-CM | POA: Insufficient documentation

## 2018-10-09 NOTE — Procedures (Addendum)
Fetal Non-Stress Test  Patient: Lianna Sitzmann Age: 23 y.o.  Date of Birth: [birth year]  [id number]  MRN: [id number]  GA: [redacted]w[redacted]d    Reason for exam: Small for gestational age     Maternal   Heart Rate: 81 (10/09/18 1440)  BP: 108/76 (10/09/18 1440)    Education Done:   External fetal monitoring: yes   Fetal kick counts: yes   NST: yes   VAS: yes   Manual stimulation:yes    Other     Fetal non-stress test for singleton pregnancy  Pre Procedure Diagnosis: SGA (small for gestational age)  Post Procedure Diagnosis: SGA (small for gestational age)            NST Start Time:  10/09/2018 2:39 PM            Uterine Irritability: No            Contractions: rare    NST Fetus A  Variability: moderate  FHR Baseline: 135  Decelerations: none   Accelerations: none   Stimulation: none  NST Interpretation: reactive    End Time:  10/09/2018 3:00 PM          Duration of test (min): 21.07  Location of NST Fetal Heart Tracing: Archived electronically        Test performed By: Kandis Fantasia  10/09/2018 2:48 PM     Marisa Severin, MD  Maternal Fetal Medicine  [phone]

## 2018-10-10 ENCOUNTER — Encounter: Payer: Medicaid (Managed Care) | Admitting: Surgery

## 2018-10-17 ENCOUNTER — Ambulatory Visit: Payer: Medicaid (Managed Care) | Admitting: Obstetrics and Gynecology

## 2018-10-17 ENCOUNTER — Observation Stay
Admission: AD | Admit: 2018-10-17 | Discharge: 2018-10-17 | Disposition: A | Discharge status: Home / Self Care | Payer: Medicaid (Managed Care) | Source: Ambulatory Visit | Attending: Obstetrics and Gynecology | Admitting: Obstetrics and Gynecology

## 2018-10-17 VITALS — BP 120/84 | HR 83 | Ht 67.01 in | Wt 142.6 lb

## 2018-10-17 DIAGNOSIS — IMO0002 Reserved for concepts with insufficient information to code with codable children: Secondary | ICD-10-CM

## 2018-10-17 DIAGNOSIS — O26899 Other specified pregnancy related conditions, unspecified trimester: Secondary | ICD-10-CM

## 2018-10-17 DIAGNOSIS — Z3A35 35 weeks gestation of pregnancy: Secondary | ICD-10-CM | POA: Insufficient documentation

## 2018-10-17 DIAGNOSIS — E860 Dehydration: Secondary | ICD-10-CM

## 2018-10-17 DIAGNOSIS — O2613 Low weight gain in pregnancy, third trimester: Secondary | ICD-10-CM

## 2018-10-17 DIAGNOSIS — O26893 Other specified pregnancy related conditions, third trimester: Principal | ICD-10-CM | POA: Insufficient documentation

## 2018-10-17 DIAGNOSIS — Z3490 Encounter for supervision of normal pregnancy, unspecified, unspecified trimester: Secondary | ICD-10-CM

## 2018-10-17 DIAGNOSIS — R519 Headache, unspecified: Secondary | ICD-10-CM

## 2018-10-17 DIAGNOSIS — R42 Dizziness and giddiness: Secondary | ICD-10-CM

## 2018-10-17 LAB — POCT URINALYSIS DIPSTICK
Blood,UA POCT: NEGATIVE
Glucose,UA POCT: NORMAL mg/dL
Ketones,UA POCT: NEGATIVE mg/dL
Lot #: 42456805
Nitrite,UA POCT: POSITIVE — AB
PH,UA POCT: 7 (ref 5–8)

## 2018-10-17 MED ORDER — METOCLOPRAMIDE HCL 10 MG PO TABS *I*
5.0000 mg | ORAL_TABLET | Freq: Once | ORAL | Status: AC
Start: 2018-10-17 — End: 2018-10-17
  Administered 2018-10-17: 5 mg via ORAL
  Filled 2018-10-17: qty 1

## 2018-10-17 MED ORDER — ACETAMINOPHEN 500 MG PO TABS *I*
1000.0000 mg | ORAL_TABLET | Freq: Once | ORAL | Status: AC
Start: 2018-10-17 — End: 2018-10-17
  Administered 2018-10-17: 1000 mg via ORAL
  Filled 2018-10-17: qty 2

## 2018-10-17 MED ORDER — LACTATED RINGERS IV BOLUS *I*
2000.0000 mL | Freq: Once | INTRAVENOUS | Status: AC
Start: 2018-10-17 — End: 2018-10-17
  Administered 2018-10-17: 2000 mL via INTRAVENOUS

## 2018-10-17 NOTE — Procedures (Signed)
Fetal Non-Stress Test  Patient: Nichole Carter Age: 23 y.o.  Date of Birth: 03-16-1995  [id number]  MRN: [id number]  GA: [redacted]w[redacted]d    Reason for exam:n Evaluation in triage for dehydration   Maternal   Heart Rate: 85 (10/17/18 1440)  BP: 100/67 (10/17/18 1440)    Education Done:   External fetal monitoring: yes   Fetal kick counts: yes   NST: yes   VAS: no   Manual stimulation:no    Other     Fetal non-stress test for singleton pregnancy  Pre Procedure Diagnosis: Pregnancy  Post Procedure Diagnosis:            NST Start Time:  10/17/2018 2:38 PM            Uterine Irritability: No            Contractions: rare    NST Fetus A  Variability: moderate  FHR Baseline: 135  Decelerations: none   Accelerations: at least 15 BPM for 15 seconds  Stimulation: none  NST Interpretation: reactive    End Time:  10/17/2018 3:00 PM          Duration of test (min): 22  Location of NST Fetal Heart Tracing: Archived electronically  Reviewed with:  Ma,MD        Test performed By: Charlott Rakes, RN  10/17/2018 5:34 PM

## 2018-10-17 NOTE — OB Triage Note (Signed)
Pt discharged home undelivered per Ma,MD- IV removed, discharge instructions given and reviewed, pt verbalizes understanding of above, pt instructed to call with decreased fetal movement, bleeding, abdominal pain and or ROM, pt has no further questions, left ambulating

## 2018-10-17 NOTE — Discharge Instructions (Signed)
You came to triage due to headache, blurry vision, dizziness.  We believe that you were dehydrated, causing your symptoms.  We gave you IV fluids, tylenol and reglan for your headache.  Your symptoms improved, your baby looked good on the monitor, and your vital signs were normal.  You are being discharged home - please be sure to drink plenty of water, especially on busy days!  You should return for unresolved symptoms, symptoms that are worsening, or new worrisome symptoms

## 2018-10-17 NOTE — Progress Notes (Signed)
[facility] Prenatal Visit     Nichole Carter is a 23 y.o. G2P0010 at [redacted]w[redacted]d gestational age who presents for routine prenatal care visit. She reports having a headache more of the time than not in the past several days. She reports difficulty sleeping. She reports blurry vision today, and feeling "blah". She states that she has not been able to eat or drink today due to nausea, and she did have an episode of vomiting this morning when she tried to drink fluids. She denies RUQ pain, reports occasional lower extremity swelling. She reports adequate and frequent fetal movement.    Contractions: denies  Loss of fluid: denies  Vaginal bleeding: denies    She denies loss of consciousness.    Problem list, vitals, family & social history, medications, and allergies were reviewed and updated as appropriate today.    OBJECTIVE  Vitals:    10/17/18 1343   BP: 120/84   Pulse: 83   Weight: 64.7 kg (142 lb 9.6 oz)   Height: 1.702 m (5' 7.01")     Body mass index is 22.33 kg/m.   Total weight gain: 5.715 kg (12 lb 9.6 oz)  Fundal Height (cm): 33 cm  Fetal Heart Rate: 142 , movement noted on exam    Physical Exam  General: AAOx3, appears fatigued, dry mucous membranes  Pulm: Normal respiratory effort  Cardiac: RRR  Abd: Non-tender, gravid, S < D  LE: No edema bilaterally. Symmetric, non-tender lower extremities.      Last USN 9/30 at [redacted]w[redacted]d showed EFW at 24th percentile      ASSESSMENT & PLAN  This is a 23 y.o. G2P0010 woman at [redacted]w[redacted]d gestational age, here for prenatal care.     Problem List Items Addressed This Visit     Poor fetal growth    Insufficient weight gain during pregnancy      Other Visit Diagnoses     [redacted] weeks gestation of pregnancy    -  Primary    Antepartum dehydration        Headache in pregnancy              Advised pt to present to [facility] triage for evaluation and IV fluids. Report called to [facility] triage nurse.        Return for [facility] in 1 weeks    15 min face to face time spent with the patient; >50% of this  time was spent on counseling and coordination of care.    Pincus Sanes, DNP, FNP-BC  Nurse Practitioner  [facility]

## 2018-10-17 NOTE — Patient Instructions (Signed)
Patient Education   Pregnancy at 35 to 38 Weeks   AMBULATORY CARE:   What changes are happening to your body:  You are considered full term at the beginning of 37 weeks. Your breathing may be easier if your baby has moved down into a head-down position. You may need to urinate more often because the baby may be pressing on your bladder. You may also feel more discomfort and get tired easily.   Seek care immediately if:   · You develop a severe headache that does not go away.    · You have new or increased vision changes, such as blurred or spotted vision.    · You have new or increased swelling in your face or hands.    · You have vaginal spotting or bleeding.    · Your water broke or you feel warm water gushing or trickling from your vagina.    Contact your healthcare provider if:   · You have more than 5 contractions in 1 hour.    · You notice any changes in your baby's movements.     · You have abdominal cramps, pressure, or tightening.    · You have a change in vaginal discharge.    · You have chills or a fever.    · You have vaginal itching, burning, or pain.     · You have yellow, green, white, or foul-smelling vaginal discharge.    · You have pain or burning when you urinate, less urine than usual, or pink or bloody urine.    · You have questions or concerns about your condition or care.    How to care for yourself at this stage of your pregnancy:   · Eat a variety of healthy foods.  Healthy foods include fruits, vegetables, whole-grain breads, low-fat dairy foods, beans, lean meats, and fish. Drink liquids as directed. Ask how much liquid to drink each day and which liquids are best for you. Limit caffeine to less than 200 milligrams each day. Limit your intake of fish to 2 servings each week. Choose fish low in mercury such as canned light tuna, shrimp, salmon, cod, or tilapia. Do not  eat fish high in mercury such as swordfish, tilefish, king mackerel, and shark.         · Take prenatal vitamins as  directed.  Your need for certain vitamins and minerals, such as folic acid, increases during pregnancy. Prenatal vitamins provide some of the extra vitamins and minerals you need. Prenatal vitamins may also help to decrease the risk of certain birth defects.    · Rest as needed.  Put your feet up if you have swelling in your ankles and feet.         · Do not smoke.  Smoking increases your risk of a miscarriage and other health problems during your pregnancy. Smoking can cause your baby to be born early or weigh less at birth. Ask your healthcare provider for information if you need help quitting.    · Do not drink alcohol.  Alcohol passes from your body to your baby through the placenta. It can affect your baby's brain development and cause fetal alcohol syndrome (FAS). FAS is a group of conditions that causes mental, behavior, and growth problems.    · Talk to your healthcare provider before you take any medicines.  Many medicines may harm your baby if you take them when you are pregnant. Do not take any medicines, vitamins, herbs, or supplements   without first talking to your healthcare provider. Never use illegal or street drugs (such as marijuana or cocaine) while you are pregnant.    · Talk to your healthcare provider before you travel.  You may not be able to travel in an airplane after 36 weeks. He may also recommend that you avoid long road trips.    Safety tips during pregnancy:   · Avoid hot tubs and saunas.  Do not use a hot tub or sauna while you are pregnant, especially during your first trimester. Hot tubs and saunas may raise your baby's temperature and increase the risk of birth defects.    · Avoid toxoplasmosis.  This is an infection caused by eating raw meat or being around infected cat feces. It can cause birth defects, miscarriages, and other problems. Wash your hands after you touch raw meat. Make sure any meat is well-cooked before you eat it. Avoid raw eggs and unpasteurized milk. Use gloves or  ask someone else to clean your cat's litter box while you are pregnant.     · Ask your healthcare provider about travel.  The most comfortable time to travel is during the second trimester. Ask your healthcare provider if you can travel after 36 weeks. You may not be able to travel in an airplane after 36 weeks. He may also recommend that you avoid long road trips.    Changes that are happening with your baby:  By 38 weeks, your baby may weigh between 6 and 9 pounds. Your baby may be about 14 inches long from the top of the head to the rump (baby's bottom). Your baby hears well enough to know your voice. As your baby gets larger, you may feel fewer kicks and more stretching and rolling. Your baby may move into a head-down position. Your baby will also rest lower in your abdomen.   What you need to know about prenatal care:  Your healthcare provider will check your blood pressure and weight. You may also need the following:  · A urine test  may also be done to check for sugar and protein. These can be signs of gestational diabetes or infection. Protein in your urine may also be a sign of preeclampsia. Preeclampsia is a condition that can develop during week 20 or later of your pregnancy. It causes high blood pressure, and it can cause problems with your kidneys and other organs.     · A blood test  may be done to check for anemia (low iron level).    · A Tdap vaccine  may be recommended by your healthcare provider.     · A group B strep test  is a test that is done to check for group B strep infection. Group B strep is a type of bacteria that may be found in the vagina or rectum. It can be passed to your baby during delivery if you have it. Your healthcare provider will take swab your vagina or rectum and send the sample to the lab for tests.     · Fundal height  is a measurement of your uterus to check your baby's growth. This number is usually the same as the number of weeks that you have been pregnant. Your  healthcare provider may also check your baby's position.     · Your baby's heart rate  will be checked.    © Copyright [employer] 2020 Information is for End User's use only and may not be sold, redistributed or   otherwise used for commercial purposes. All illustrations and images included in CareNotes® are the copyrighted property of A.D.A.M., Inc[employer] or [employer]  The above information is an educational aid only. It is not intended as medical advice for individual conditions or treatments. Talk to your doctor, nurse or pharmacist before following any medical regimen to see if it is safe and effective for you.

## 2018-10-17 NOTE — OB Triage Note (Signed)
OBSTETRICS TRIAGE NOTE      Chief Complaint: Headache, dizziness, contractions (braxton hicks)     HPI     Nichole Carter is a 23 y.o. G2P0010 at 75w0dby LMP c/w earlyw ultrasound with pregnancy complicated by risks outlined below who presents with dizziness, headache, braxton hicks contractions    She reports that she has passed out twice during this pregnancy before 2/2 inadequate fluid intake. Today she has not had any PO fluid intake and has been "running all over" trying to buy a new car. She began to feel dizzy, have blurry vision, and feel generally unwell.      Pregnancy Risks       Anemia,     Depression    Poor fetal growth      Obstetrical History     OB History   Gravida Para Term Preterm AB Living   2 0 0 0 1 0   SAB TAB Ectopic Multiple Live Births   0 1 0 0 0      # Outcome Date GA Lbr Len/2nd Weight Sex Delivery Anes PTL Lv   2 Current            1 TAB 07/27/17 [redacted]w[redacted]d              PMH, PSH, SH, GYN History, Allergies, and Current Medications reviewed and updated in eRecord.       Review of Systems     Constitutional: Negative for fever or fatigue.  Psych: Denies depressive symptoms or anxiety.  Cardiovascular: Denies chest pain or palpitations.  Respiratory: Denies shortness of breath or orthopnea.  Gastrointestinal: Denies nausea/vomiting.  No abdominal pain.  No flank pain.  Musculoskeletal: Denies muscle weakness.  Skin/Extremities: no peripheral edema.  Denies new skin rashes or lesions.  Neurologic: YES headaches and vision changes.  Denies syncope.  Denies recent falls.  Genitourinary: Denies dysuria.  Denies urinary frequency or urgency.  Denies hematuria.  Denies flank pain.  Denies vaginal bleeding.  Denies leakage of fluid.      Prenatal Labs     No results for input(s): WBC, HGB, HCT, PLT in the last 168 hours.  No results for input(s): NA, K, CL, CO2, UN, CREAT, GFRC, GLU in the last 168 hours. No results for input(s): LD, URIC, ALT, AST, ALK, TB in the last 168 hours.   No results for  input(s): UTPR, UCRR in the last 168 hours.  Urine spot P/C ratio:  No results for input(s): TPCREATRATIO in the last 8760 hours.          Lab results: 08/28/18  1138 05/15/18  1613 05/13/18  1132   ABO RH Blood Type  --   --  O RH POS   Rubella IgG AB  --   --  POSITIVE   Syphilis Screen Neg  --  Neg   HIV 1&2 ANTIGEN/ANTIBODY  --   --  Nonreactive   HBV S Ag  --   --  NEG   N. gonorrhoeae DNA Amplification  --  .  --    Chlamydia Plasmid DNA Amplification  --  .  --         Lab results: 08/28/18  1138   Glucose,50gm 1HR 121      No results for input(s): GL0, GL1H, GL2H, GLPinehurstn the last 8760 hours.        Physical Exam     Vitals:    10/17/18 1440   BP: 100/67  Pulse: 85   Resp: 16   Temp: 36.4 C (97.5 F)   TempSrc: Temporal   SpO2: 100%   Height: 1.702 m ('5\' 7"' )       General Appearance: healthy and no distress  Mental Status: Alert and oriented x 3  Mood/Affect: Mood  happy  Skin: color normal, no edema, temperature normal and mobility and turgor normal  HEENT: Normocephalic, atraumatic.  Cardiovascular: Not assessed  Respiratory: Not assessed  Abdomen: Soft, gravid, non-tender.      Uterus: Graid  Neurological: Not assessed  Extremities: normal      Fetal Monitoring:  Baseline: 140 bpm  Variability: Moderate (6-25 BPM)  Accelerations: Yes 15X15  Decelerations: None  Category: 1      Assessment & Plan     Nichole Carter is a 23 y.o. G2P0010 at 76w0dwith pregnancy complicated by risks outlined above who presents with dehydration, dizziness, headache    # Headache and Dizziness  Pt arrived c/o dizziness, blurry vision, HA. Normal to low BP. Low concern for PreE or other hypertensive do of pregnancy. She reports poor PO intake all day.   - given 2L IVF, Reglan, tylenol with significant improvement in symptoms.   - reactive NST  - urinating normally, BP normal, reports feeling well.   - safe for discharge home with most likely diagnosis of dehydration     D/w Dr. MToney SangMD, MA  Maternal Child Health  Fellow   [facility]

## 2018-10-25 ENCOUNTER — Ambulatory Visit: Payer: Medicaid (Managed Care) | Attending: Obstetrics and Gynecology | Admitting: Obstetrics and Gynecology

## 2018-10-25 VITALS — BP 102/62 | HR 61 | Temp 97.7°F | Ht 67.0 in | Wt 145.5 lb

## 2018-10-25 DIAGNOSIS — IMO0002 Reserved for concepts with insufficient information to code with codable children: Secondary | ICD-10-CM

## 2018-10-25 DIAGNOSIS — Z3A36 36 weeks gestation of pregnancy: Secondary | ICD-10-CM

## 2018-10-25 DIAGNOSIS — Z3493 Encounter for supervision of normal pregnancy, unspecified, third trimester: Secondary | ICD-10-CM | POA: Insufficient documentation

## 2018-10-25 DIAGNOSIS — O2613 Low weight gain in pregnancy, third trimester: Secondary | ICD-10-CM | POA: Insufficient documentation

## 2018-10-25 NOTE — Assessment & Plan Note (Addendum)
-   Continue weekly NSTs (OK for early next week)  - Repeat G&F USN given no FH change since last visit and borderline EFW on recent USN   - Discussed we might to have a conversation about IOL at her next visit if she has IUGR. She expressed understanding.

## 2018-10-25 NOTE — Assessment & Plan Note (Signed)
-   GBS collected today  - Reviewed warning signs/reasons to call

## 2018-10-25 NOTE — Progress Notes (Signed)
[place]    OB Check at [redacted]w[redacted]d     SUBJECTIVE  She reports feeling well, much better than last week. Reports lots of active fetal movement and denies contractions, leakage of fluids, vaginal bleeding.     Problem list, vitals, family & social history, medications, and allergies were reviewed and updated as appropriate today.    OBJECTIVE  Vitals:    10/25/18 1324   BP: 102/62   Pulse: 61   Temp: 36.5 C (97.7 F)   Weight: 66 kg (145 lb 8 oz)   Height: 1.702 m (5\' 7" )     Body mass index is 22.79 kg/m.   Total weight gain: 7.031 kg (15 lb 8 oz)  Fundal Height (cm): 33 cm  Fetal Heart Rate: 145      Physical Exam  General: AAOx3 in NAD  Pulm: Normal respiratory effort  Abd: Non-tender, gravid            ASSESSMENT & PLAN  This is a 23 y.o. G2P0010 woman at [redacted]w[redacted]d gestational age, here for prenatal care.     Problem List Items Addressed This Visit     Prenatal care     - GBS collected today  - Reviewed warning signs/reasons to call         Relevant Orders    Group b strep culture (Recto-Vag)    Poor fetal growth     - Continue weekly NSTs (OK for early next week)  - Repeat G&F USN given no FH change since last visit and borderline EFW on recent USN   - Discussed we might to have a conversation about IOL at her next visit if she has IUGR. She expressed understanding.         Relevant Orders    OB ultrasound scan    Insufficient weight gain during pregnancy    Relevant Orders    OB ultrasound scan      Other Visit Diagnoses     [redacted] weeks gestation of pregnancy    -  Primary    Relevant Orders    Group b strep culture (Recto-Vag)        Next OBC: 1 week    Everitt Amber, M.D.  Signed 10/25/2018 at 3:24 PM

## 2018-10-27 ENCOUNTER — Telehealth: Payer: Self-pay | Admitting: Student in an Organized Health Care Education/Training Program

## 2018-10-27 LAB — GROUP B STREP CULTURE: Group B Strep Culture: 0

## 2018-10-27 NOTE — Telephone Encounter (Signed)
Hospital is calling at from [facility] in [place] requesting prenatal records as patient presented to L and D for labor check.     Fax number [phone]  Phone [phone]    Bettey Mare is staff calling.   Prenatal records printed    RN Cassie to fax records.    Elmon Else, DO  Maternal Child Health Fellow   [facility]  [facility]

## 2018-10-27 NOTE — Telephone Encounter (Signed)
Answering Service Contact: Term Labor Concerns    Nichole Carter is a 23 y.o. G2P0010 at [redacted]w[redacted]d who called the answering service to report regular contractions that started earlier today. Currently occur every 5 minutes stronger and closer.  Additionally, denies any leaking but unsure since she has been using the restroom a lot. Patient is currently in [place] but has her prenatal care at [facility].    Pregnancy ROS:  Contractions: yes  LOF: unsure  VB: unsure   Fetal movement: normal    Pregnancy Risks:  - Anemia  -Depression  -Poor fetal growth      After discussing her symptoms, patient and provider agree that she should present to [facility] Triage for evaluation for active labor. Discussed with charge RN and Dr. Hassell Done, recommended that she goes to the nearest hospital in [place] as [place] is far away. If they need prenatal records, they can be faxed over to whichever hospital she goes to. Patient voiced understanding and will go to a hospital in [place]. She was encouraged to call our service if her symptoms changed or she had further questions.     Elmon Else, DO  Maternal Child Health Fellow   [facility]  [facility]

## 2018-10-29 ENCOUNTER — Other Ambulatory Visit: Payer: Medicaid (Managed Care)

## 2018-10-30 ENCOUNTER — Ambulatory Visit: Payer: Medicaid (Managed Care) | Attending: Obstetrics and Gynecology

## 2018-10-30 DIAGNOSIS — IMO0002 Reserved for concepts with insufficient information to code with codable children: Secondary | ICD-10-CM

## 2018-10-30 DIAGNOSIS — O2613 Low weight gain in pregnancy, third trimester: Secondary | ICD-10-CM | POA: Insufficient documentation

## 2018-10-30 DIAGNOSIS — O365930 Maternal care for other known or suspected poor fetal growth, third trimester, not applicable or unspecified: Secondary | ICD-10-CM

## 2018-11-01 ENCOUNTER — Telehealth: Payer: Self-pay

## 2018-11-01 ENCOUNTER — Encounter: Payer: Medicaid (Managed Care) | Admitting: Obstetrics & Gynecology

## 2018-11-01 NOTE — Telephone Encounter (Signed)
Call from the pt. She is requesting to cancel appt for today. She is coming from [place] and will not make it. Writer spoke with Dr. Marland Kitchen. Inquired when pt will be in the area next. Pt states she has a dental appointment on Monday at 130PM. Spoke with Dr. Marland Kitchen. Offered appointment 10/26 at 4 PM. Pt agreeable. Message sent to manager to schedule pt.     Eula Fried, RN

## 2018-11-04 ENCOUNTER — Encounter: Payer: Medicaid (Managed Care) | Admitting: Obstetrics & Gynecology

## 2018-11-08 ENCOUNTER — Ambulatory Visit: Payer: Medicaid (Managed Care) | Admitting: Obstetrics and Gynecology

## 2018-11-08 VITALS — BP 116/82 | HR 85 | Temp 96.9°F | Ht 67.0 in | Wt 144.2 lb

## 2018-11-08 DIAGNOSIS — Z3A38 38 weeks gestation of pregnancy: Secondary | ICD-10-CM

## 2018-11-08 DIAGNOSIS — IMO0002 Reserved for concepts with insufficient information to code with codable children: Secondary | ICD-10-CM

## 2018-11-08 DIAGNOSIS — Z3493 Encounter for supervision of normal pregnancy, unspecified, third trimester: Secondary | ICD-10-CM

## 2018-11-08 DIAGNOSIS — O2613 Low weight gain in pregnancy, third trimester: Secondary | ICD-10-CM

## 2018-11-08 NOTE — Assessment & Plan Note (Signed)
-   Discussed UTD on all routine prenatal care  - Discussed we will be arranging a late-term IOL at 41 weeks if USN next week does not present an indication for IOL sooner

## 2018-11-08 NOTE — Progress Notes (Signed)
[place]    OB Check at [redacted]w[redacted]d     SUBJECTIVE  She reports feeling okay. She is nauseated like she has been. She is also feeling irregular contractions, but nothing particularly painful or predictable. Reports normal, active fetal movement and denies contractions, leakage of fluids, vaginal bleeding.     Problem list, vitals, family & social history, medications, and allergies were reviewed and updated as appropriate today.    OBJECTIVE  Vitals:    11/08/18 1419   BP: 116/82   Pulse: 85   Temp: 36.1 C (96.9 F)   Weight: 65.4 kg (144 lb 3.2 oz)   Height: 1.702 m (5\' 7" )     Body mass index is 22.58 kg/m.   Total weight gain: 6.441 kg (14 lb 3.2 oz)     Fetal Heart Rate: 150        Physical Exam  General: AAOx3 in NAD  Pulm: Normal respiratory effort  Abd: Non-tender, gravid            ASSESSMENT & PLAN  This is a 23 y.o. G2P0010 woman at [redacted]w[redacted]d gestational age, here for prenatal care.     Problem List Items Addressed This Visit     Prenatal care     - Discussed UTD on all routine prenatal care  - Discussed we will be arranging a late-term IOL at 41 weeks if USN next week does not present an indication for IOL sooner         Poor fetal growth     - Repeat USN ordered as patient still measuring significantly less than dates. Discussed USN next week may present an indication for IOL if there are signs of IUGR.         Relevant Orders    OB ultrasound scan    Insufficient weight gain during pregnancy    Relevant Orders    OB ultrasound scan      Other Visit Diagnoses     [redacted] weeks gestation of pregnancy    -  Primary        Next OBC: 1 week    Everitt Amber, M.D.  Signed 11/08/2018 at 2:52 PM

## 2018-11-08 NOTE — Assessment & Plan Note (Signed)
-   Repeat USN ordered as patient still measuring significantly less than dates. Discussed USN next week may present an indication for IOL if there are signs of IUGR.

## 2018-11-08 NOTE — Progress Notes (Signed)
SW intended to meet with the patient following her OB visit however she left the office prior to being able to do so.  SW to plan to see her at her next visit.  Rayann Heman, MSW  Pager [phone]

## 2018-11-13 ENCOUNTER — Inpatient Hospital Stay
Admission: AD | Admit: 2018-11-13 | Discharge: 2018-11-15 | DRG: 560 | Disposition: A | Discharge status: Home / Self Care | Payer: Medicaid (Managed Care) | Source: Ambulatory Visit | Attending: Obstetrics and Gynecology | Admitting: Obstetrics and Gynecology

## 2018-11-13 ENCOUNTER — Encounter: Payer: Medicaid (Managed Care) | Admitting: Obstetrics and Gynecology

## 2018-11-13 ENCOUNTER — Ambulatory Visit: Payer: Medicaid (Managed Care)

## 2018-11-13 DIAGNOSIS — O2613 Low weight gain in pregnancy, third trimester: Secondary | ICD-10-CM

## 2018-11-13 DIAGNOSIS — O365930 Maternal care for other known or suspected poor fetal growth, third trimester, not applicable or unspecified: Principal | ICD-10-CM | POA: Diagnosis present

## 2018-11-13 DIAGNOSIS — Z3A38 38 weeks gestation of pregnancy: Secondary | ICD-10-CM | POA: Insufficient documentation

## 2018-11-13 DIAGNOSIS — IMO0002 Reserved for concepts with insufficient information to code with codable children: Secondary | ICD-10-CM

## 2018-11-13 DIAGNOSIS — Z20828 Contact with and (suspected) exposure to other viral communicable diseases: Secondary | ICD-10-CM | POA: Diagnosis present

## 2018-11-13 DIAGNOSIS — O365990 Maternal care for other known or suspected poor fetal growth, unspecified trimester, not applicable or unspecified: Secondary | ICD-10-CM

## 2018-11-13 LAB — CBC
Hematocrit: 34 % (ref 34–45)
Hemoglobin: 10.9 g/dL — ABNORMAL LOW (ref 11.2–15.7)
MCH: 27 pg (ref 26–32)
MCHC: 32 g/dL (ref 32–36)
MCV: 83 fL (ref 79–95)
Platelets: 237 10*3/uL (ref 160–370)
RBC: 4 MIL/uL (ref 3.9–5.2)
RDW: 14 % (ref 11.7–14.4)
WBC: 7.2 10*3/uL (ref 4.0–10.0)

## 2018-11-13 LAB — TYPE AND SCREEN
ABO RH Blood Type: O POS
Antibody Screen: NEGATIVE

## 2018-11-13 LAB — HOLD SST

## 2018-11-13 MED ORDER — OXYTOCIN 30 UNITS/500 ML NS *POSTPARTUM* *I*
100.0000 m[IU]/min | INTRAMUSCULAR | Status: AC
Start: 2018-11-13 — End: 2018-11-13

## 2018-11-13 MED ORDER — MISOPROSTOL 25 MCG QUARTER TAB *I*
50.0000 ug | ORAL_TABLET | ORAL | Status: AC
Start: 2018-11-13 — End: 2018-11-14
  Administered 2018-11-13 – 2018-11-14 (×4): 50 ug via ORAL
  Filled 2018-11-13 (×4): qty 2

## 2018-11-13 MED ORDER — LACTATED RINGERS IV BOLUS *I*
1000.0000 mL | INTRAVENOUS | Status: DC | PRN
Start: 2018-11-13 — End: 2018-11-14
  Administered 2018-11-13 – 2018-11-14 (×3): 1000 mL via INTRAVENOUS

## 2018-11-13 MED ORDER — SODIUM CHLORIDE 0.9 % FLUSH FOR PUMPS *I*
0.0000 mL/h | INTRAVENOUS | Status: DC | PRN
Start: 2018-11-13 — End: 2018-11-14

## 2018-11-13 MED ORDER — LACTATED RINGERS IV SOLN *I*
150.0000 mL/h | INTRAVENOUS | Status: DC
Start: 2018-11-13 — End: 2018-11-14

## 2018-11-13 MED ORDER — DEXTROSE 5 % FLUSH FOR PUMPS *I*
0.0000 mL/h | INTRAVENOUS | Status: DC | PRN
Start: 2018-11-13 — End: 2018-11-14

## 2018-11-13 MED ORDER — LIDOCAINE HCL (PF) 1 % IJ SOLN *I*
20.0000 mL | Freq: Once | INTRAMUSCULAR | Status: DC
Start: 2018-11-13 — End: 2018-11-14
  Filled 2018-11-13: qty 30

## 2018-11-13 NOTE — H&P (Signed)
OBSTETRICS ADMISSION HISTORY & PHYSICAL      Primary OB-GYN: [facility]    Reason for Admission (Chief Complaint): poor fetal growth    HPI     Nichole Carter is a 23 y.o. G2P0010 at 9w6dby LMP c/w 12w ultrasound with pregnancy complicated by risks outlined below who presents for IOL after it was found that her baby had lost weight on it's growth ultrasound. She denies contractions, VB, and LOF. Normal FM.      Pregnancy Risks     Poor fetal growth  - 11/4: EFW 2499g, AFI wnl, dopplers wnl, BPP 8/8  - 10/21: EFW 3022g    Sickle cell trait    Depression    Disorder of RV  - c/f endocarditis in 2017 --> echo showed dilated RV, mild pulm HTN, no PE on V/Q scan  - normal echo May 2020    Past Medical History     Past Medical History:   Diagnosis Date    Anxiety     Bacteremia 08/15/2015    Depression     Folate deficiency 10/29/2015    Pyelonephritis 10/16/2015    October 2017:  Loose stools after IV antibiotics:  FIT negative;  Culture and lactoferrin cancelled by lab;        Past Surgical History   No past surgical history on file.    Obstetrical History     OB History   Gravida Para Term Preterm AB Living   2 0 0 0 1 0   SAB TAB Ectopic Multiple Live Births   0 1 0 0 0      # Outcome Date GA Lbr Len/2nd Weight Sex Delivery Anes PTL Lv   2 Current            1 TAB 07/27/17 179w0d            Allergies   No Known Allergies (drug, envir, food or latex)    Current Home Medications     Prior to Admission medications    Medication Sig Start Date End Date Taking? Authorizing Provider   Ferrous Sulfate (IRON PO) Take by mouth 2 Gummi's po daily    [provider]   NONFORMULARY, OTHER, ORDER *I* Boost or Ensure BID po daily    [provider]   prenatal plus iron (PRENAVITE) tablet Take 1 tablet by mouth daily 02/10/16   PiEugenie NorrieMD       GYN History, Social History, and Family History reviewed and updated in eRecord.      Review of Systems     Constitutional: Negative for fever or fatigue.  Psych:  Denies depressive symptoms or anxiety.  Cardiovascular: Denies chest pain or palpitations.  Respiratory: Denies shortness of breath or orthopnea.  Gastrointestinal: Denies nausea/vomiting.  Denies abdominal pain.  Denies flank pain.  Musculoskeletal: Denies muscle weakness.  Skin/Extremities: Denies peripheral edema.  Denies new skin rashes or lesions.  Neurologic: Denies headaches or vision changes.  Denies syncope.  Denies recent falls.  Genitourinary: Denies dysuria.  Denies urinary frequency or urgency.  Denies hematuria.  Denies flank pain.  Denies vaginal bleeding.  Denies leakage of fluid.      Prenatal Labs     Recent Labs   Lab 11/13/18  1343   WBC 7.2   Hemoglobin 10.9*   Hematocrit 34   Platelets 237     No results for input(s): NA, K, CL, CO2, UN, CREAT, GFRC, GLU in the last 168  hours. No results for input(s): LD, URIC, ALT, AST, ALK, TB in the last 168 hours.   No results for input(s): UTPR, UCRR in the last 168 hours.    Urine spot P/C ratio:  No results for input(s): TPCREATRATIO in the last 8760 hours.          Lab results: 11/13/18  1343 10/25/18  1402 08/28/18  1138 05/15/18  1613 05/13/18  1132   ABO RH Blood Type O RH POS  --   --   --  O RH POS   Rubella IgG AB  --   --   --   --  POSITIVE   Group B Strep Culture  --  .  --   --   --    Syphilis Screen  --   --  Neg  --  Neg   HIV 1&2 ANTIGEN/ANTIBODY  --   --   --   --  Nonreactive   HBV S Ag  --   --   --   --  NEG   N. gonorrhoeae DNA Amplification  --   --   --  .  --    Chlamydia Plasmid DNA Amplification  --   --   --  .  --         Lab results: 08/28/18  1138   Glucose,50gm 1HR 121      No results for input(s): GL0, GL1H, GL2H, GL3H in the last 8760 hours.        Prenatal Ultrasound Review (Pertinent Findings):  6/26: no gross anatomic defects. Small subchorionic collection noted  9/15: EFW 14%ile, AFI wnl, normal UA and MCA dopplers, BPP 8/8  9/23: AFI wnl, UA and MCA dopplers wnl  9/30: EFW 24%ile, AFI wnl, normal UA and MCA  dopplers  10/21: EFW 25%ile, AFI wnl  11/4: EFW 1.8%ile (no interval growth), AFI wnl, UA and MCA dopplers normal, BPP 8/10    Physical Exam     Vitals:    11/13/18 1310 11/13/18 1544   BP: 105/69 122/72   Pulse: 82 68   Resp: 16 18   Temp: 36.2 C (97.2 F) 36.5 C (97.7 F)   TempSrc: Oral Oral   SpO2: 97%    Weight: 65.8 kg (145 lb)    Height: 1.702 m (_0 )        General Appearance: healthy, alert, active and no distress  Mental Status: Alert and oriented x 3  HEENT: Normocephalic, atraumatic.  Cardiovascular: normal rate, regular rhythm, normal S1, S2, no murmurs, rubs, clicks or gallops  Respiratory: clear to auscultation, no wheezes, rales or rhonchi, symmetric air entry  Abdomen: Soft, gravid, non-tender.      Uterus: Gravid.  Non-tender to palpation.  Neurological: grossly normal  Extremities: normal    Estimated Fetal Weight: 2599 grams by USN today    Presentation: cephalic by ultrasound    Placental location: posterior      Fetal Monitoring:  Baseline: 135 bpm  Variability: Moderate (6-25 BPM)  Accelerations: Yes 15X15  Decelerations: None  Category: I  Toco: irritability    Assessment & Plan     Nichole Carter is a 23 y.o. G2P0010 at 44w6dwith pregnancy complicated by risks outlined above admitted for IOL for poor fetal growth.    Admit to 3LewellenCBC, T&S, and Syphilis screen sent on admission.   - Cervix: not examined / Membranes: intact   -  Presentation: cephalic / EFW: 5825 grams by USN today   - Category I fetal heart tracing.  continuous EFM ordered.   - Due to no risk factors, risk of PPH is: Low (Type & Screen)    Labor Plan   - miso   - Maternal and fetal risks and benefits of delivery between 36 0/7 and 38 6/7 weeks were discussed with the mother. Patient understands the risks and benefits of delivery at < [redacted] weeks gestation and that the indication(s) of  Poor fetal growth makes delivery at this time appropriate.     Postpartum planning   - Rh positive / Rubella immune /HIV  negative / GBS negative   - Infant: Expecting female infant, desires circumcision.   - Feeding: breast   - PPBC: depo   - Immunizations: Up to date.    D/w Dr. Freida Busman C. Lyndee Hensen, MD  Obstetrics and Gynecology Resident, PGY2  Pager: [phone]

## 2018-11-13 NOTE — Progress Notes (Signed)
OBSTETRICS PROGRESS NOTE       Subjective     In to see patient at change of shift. Patient comfortable, no concerns.       Objective     Vitals:    11/13/18 1310 11/13/18 1544 11/13/18 1758   BP: 105/69 122/72 121/76   Pulse: 82 68 91   Resp: 16 18    Temp: 36.2 C (97.2 F) 36.5 C (97.7 F) 36.8 C (98.2 F)   TempSrc: Oral Oral Oral   SpO2: 97%     Weight: 65.8 kg (145 lb)     Height: 1.702 m (5\' 7" )         Most recent cervical exam:    Primip, has felt no contractions, deferred.       Membranes:   Membrane Status: Intact           Fetal Monitoring:  Baseline: 135 bpm  Variability: moderate  Accelerations: present  Decelerations: None  Category: 1  Toco: uterine irritability, contractions q6      Assessment & Plan     Donetta is a 23 y.o. G2P0010 at [redacted]w[redacted]d admitted for IOL for poor fetal growth.    1. FHT: Category I. Interventions: cFHM.  2. Labor assessment: latent phase, continue misoprostol.  3. Pain management: patient not currently in pain, continue repositioning, movement as needed.  4. GBS status: negative.   5. Labor Risks:   Poor fetal growth  - no growth 10/21-11/4  - EFW 1.8% 11/4      Sickle cell trait     Depression     Disorder of RV  - c/f endocarditis in 2017 --> echo showed dilated RV, mild pulm HTN, no PE on V/Q scan  - normal echo May 2020      Andrew Au MD  Family Medicine PGY-1  11/13/18 6:13 PM

## 2018-11-13 NOTE — Progress Notes (Signed)
Nurse entered room to replace monitors, found patient doing squats and burpees with monitors on the bed. Pt put back on EFM at 1519.
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
## 2018-11-13 NOTE — Progress Notes (Signed)
SW intended to see the patient following her OB visit however the patient cancelled her appointment.  SW to plan to see her at her next visit to complete the term summary.  Rayann Heman, MSW  Pager [phone]

## 2018-11-13 NOTE — Progress Notes (Signed)
Verbal report and transfer of care to Naomi Turiano RN.    Aniaya Bacha S Riku Buttery, RN

## 2018-11-13 NOTE — Progress Notes (Signed)
Report and transfer of care given to Ambia, RN,  Strip reviewed at this time.  Tillie Fantasia, RN

## 2018-11-13 NOTE — Progress Notes (Signed)
Ob Attending Note:  Coming on call for the shift.  Signout received. Chart/progress reviewed. In to meet patient and support person. Patient undergoing IOL for poor fetal growth.  She is currently comfortable.  FHT cat 1 with mod variability, +spont accels, no decels.  Baseline 135bpm.  Ctxs 4-6 min on toco.  Cont miso for cervical ripening.          Hedy Jacob, MD  [facility]

## 2018-11-14 ENCOUNTER — Encounter: Payer: Self-pay | Admitting: Obstetrics & Gynecology

## 2018-11-14 DIAGNOSIS — O365930 Maternal care for other known or suspected poor fetal growth, third trimester, not applicable or unspecified: Principal | ICD-10-CM

## 2018-11-14 DIAGNOSIS — Z3A38 38 weeks gestation of pregnancy: Secondary | ICD-10-CM

## 2018-11-14 LAB — COVID-19 PCR

## 2018-11-14 LAB — SYPHILIS SCREEN
Syphilis Screen: NEGATIVE
Syphilis Status: NONREACTIVE

## 2018-11-14 LAB — COVID-19 NAAT (PCR): COVID-19 NAAT (PCR): NEGATIVE

## 2018-11-14 MED ORDER — DEXTROSE 5 % FLUSH FOR PUMPS *I*
0.0000 mL/h | INTRAVENOUS | Status: DC | PRN
Start: 2018-11-14 — End: 2018-11-15

## 2018-11-14 MED ORDER — OXYTOCIN 30 UNITS IN 500ML NS WRAPPED *I*
INTRAMUSCULAR | Status: AC
Start: 2018-11-14 — End: 2018-11-14
  Administered 2018-11-14: 150 m[IU]/min via INTRAVENOUS
  Filled 2018-11-14: qty 500

## 2018-11-14 MED ORDER — IBUPROFEN 600 MG PO TABS *I*
600.0000 mg | ORAL_TABLET | Freq: Four times a day (QID) | ORAL | Status: DC
Start: 2018-11-14 — End: 2018-11-15
  Administered 2018-11-14 – 2018-11-15 (×5): 600 mg via ORAL
  Filled 2018-11-14 (×5): qty 1

## 2018-11-14 MED ORDER — NALBUPHINE HCL 20 MG/ML IJ SOLN *I*
10.0000 mg | Freq: Once | INTRAMUSCULAR | Status: AC
Start: 2018-11-14 — End: 2018-11-14
  Administered 2018-11-14: 10 mg via INTRAVENOUS
  Filled 2018-11-14: qty 0.5

## 2018-11-14 MED ORDER — ACETAMINOPHEN 325 MG PO TABS *I*
650.0000 mg | ORAL_TABLET | Freq: Four times a day (QID) | ORAL | Status: DC
Start: 2018-11-14 — End: 2018-11-15
  Administered 2018-11-14 – 2018-11-15 (×5): 650 mg via ORAL
  Filled 2018-11-14 (×5): qty 2

## 2018-11-14 MED ORDER — NALOXONE HCL 0.4 MG/ML IJ SOLN *WRAPPED*
Status: DC
Start: 2018-11-14 — End: 2018-11-14
  Filled 2018-11-14: qty 1

## 2018-11-14 MED ORDER — SODIUM CHLORIDE 0.9 % FLUSH FOR PUMPS *I*
0.0000 mL/h | INTRAVENOUS | Status: DC | PRN
Start: 2018-11-14 — End: 2018-11-15

## 2018-11-14 MED ORDER — SIMETHICONE 80 MG PO CHEW *I*
80.0000 mg | CHEWABLE_TABLET | Freq: Four times a day (QID) | ORAL | Status: DC | PRN
Start: 2018-11-14 — End: 2018-11-15

## 2018-11-14 MED ORDER — OXYTOCIN 30 UNITS/500 ML NS *POSTPARTUM* *I*
100.0000 m[IU]/min | INTRAMUSCULAR | Status: AC
Start: 2018-11-14 — End: 2018-11-14
  Administered 2018-11-14 (×2): 150 m[IU]/min

## 2018-11-14 MED ORDER — MEDROXYPROGESTERONE ACETATE 150 MG/ML IM SUSP *I*
150.0000 mg | INTRAMUSCULAR | Status: AC
Start: 2018-11-14 — End: 2018-11-14
  Administered 2018-11-14: 150 mg via INTRAMUSCULAR
  Filled 2018-11-14: qty 1

## 2018-11-14 MED ORDER — DOCUSATE SODIUM 100 MG PO CAPS *I*
100.0000 mg | ORAL_CAPSULE | Freq: Two times a day (BID) | ORAL | Status: DC | PRN
Start: 2018-11-14 — End: 2018-11-15
  Administered 2018-11-14 – 2018-11-15 (×2): 100 mg via ORAL
  Filled 2018-11-14: qty 1

## 2018-11-14 MED ORDER — MISOPROSTOL 200 MCG PO TABS *I*
ORAL_TABLET | ORAL | Status: DC
Start: 2018-11-14 — End: 2018-11-14
  Filled 2018-11-14: qty 4

## 2018-11-14 MED ORDER — LACTATED RINGERS IV SOLN *I*
50.0000 mL/h | INTRAVENOUS | Status: AC
Start: 2018-11-14 — End: 2018-11-14
  Administered 2018-11-14: 50 mL/h
  Administered 2018-11-14: 50 mL/h via INTRAVENOUS
  Administered 2018-11-14: 50 mL/h

## 2018-11-14 MED ORDER — BENZOCAINE-MENTHOL 20-0.5 % EX AERO *I*
INHALATION_SPRAY | CUTANEOUS | Status: DC | PRN
Start: 2018-11-14 — End: 2018-11-15
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
## 2018-11-14 NOTE — Plan of Care (Signed)
See care plan

## 2018-11-14 NOTE — Progress Notes (Signed)
S  In to see patient for increased pressure, painful contractions    O  BP 112/71    Pulse 68    Temp 35.7 C (96.3 F) (Axillary)    Resp 16    Ht 1.702 m (5\' 7" )    Wt 65.8 kg (145 lb)    LMP 02/14/2018    SpO2 96%    BMI 22.71 kg/m     Cervix: 4/50/-3    Strip: baseline 130, moderate variability, numerous spontaneous accels, 1 late decel, category 2, toco q2-3 min    A/P    Nichole Carter is a 23 y.o. G2P0010 at [redacted]w[redacted]d admitted for IOL for poor fetal growth. Patient becoming more active, feeling contractions. Strip reassuring given variability, numerous accels. Plan for cFHM, administer nubain 10mg  for pain/discomfort. Will hold miso and monitor for continued contractions and progression into active phase. Consider pitocin augmentation if contractions space out.    D/w Dr. Hoyle Barr MD  Family Medicine PGY-1  11/14/18 5:41 AM

## 2018-11-14 NOTE — Lactation Note (Signed)
This note was copied from a baby's chart.  [facility] Lactation    Lactation Consultant Initial Visit   Patient: Nichole Carter MRN: [id number]  AGE: 23 days    Maternal Information    Mothers Name: Graefe,Gin   Age: 60 y.o.   OB History   Gravida Para Term Preterm AB Living   2 1 1  0 1 1   SAB TAB Ectopic Multiple Live Births   0 1 0 0 1       Delivery Date/Time: 11/14/2018 7:04 AM Delivery Type: Vaginal, Spontaneous   Support Person: yes  Friend  Breastfeeding History: first baby    Maternal Assessment    Breastfeeding: Well and Independently - per mother  Positioning: DNO. Mother had just given baby a bottle. "So baby will sleep, so mother can rest since support/friend will not be with mother over night"    Nipple Assessment:    Size: Medium    Condition: R-Normal and L-Normal               Nipple Shield:  no    Breast Assessment: Size: Medium   Condition: R-Soft and L-Soft  Comfortable with latch: yes  BF Confidence: Average   Currently Pumping: No  Personal Pump: no. Needs a script    Newborn Assessment    Newborn Name: Nichole Carter Sex: female GA: 40   Pediatrician: Linna Caprice, MD  Infant Weight: Birth Weight: 3033 g (6 lb 11 oz)   Today's Wt: 3033 g (6 lb 11 oz)(Filed from Delivery Summary)   Percent Weight Loss: 0 %  F/V/S 24 hours:   Date 11/13/18 1500 - 11/14/18 0659(Not Admitted) 11/14/18 0700 - 11/15/18 0659   Shift 1500-2259 2300-0659 24 Hour Total 0700-1459 1500-2259 2300-0659 24 Hour Total   INTAKE   Breast Feeding            BF Occurrence    4 x   4 x     BF Attempts    1 x   1 x   Shift Total(mL/kg)          OUTPUT   Shift Total(mL/kg)          NET          Weight (kg)    3 3 3 3       Baby's Feeding:   Is baby waking: yes  Tongue: Effective Latch   Attachment: Good root, Lips Flanged, Jaw glide, Tongue down, Swallow and Nutritive suck   Characteristics: Awake Root Intermittent Suckling  Is Pt.Curr Supplementing: Yes - per mother's request    Teaching    Frequency/length of feeds, Signs of adequate infant  intake (# of wet/stool diaper per 24 hrs), Engorgment Management, Breastfeeding Log Given and Cluster Feeding      Plan for next 24 hours   Follow up LC: Tomorrow and today as paged.    Metta Clines, RN, [facility]  Lactation Consultant

## 2018-11-14 NOTE — Progress Notes (Signed)
Report given and care assumed by Diane Tregea, RN  Asani Deniston, RN
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
## 2018-11-14 NOTE — Discharge Instructions (Signed)
Congratulations on your delivery!    POSTPARTUM  INSTRUCTIONS    General Activity   On the day of discharge, go home and rest. It is safe to resume normal daily activity (walking, climbing stairs). Use handrails when climbing stairs, as you may feel slightly weak or unsteady for some time. If an activity causes you pain or fatigue, stop that activity for a few days and start back slowly when you resume.       Diet  You may eat a regular normal diet, unless instructed differently by your provider.       Driving   Do not drive until you are alert, able to move quickly without pain, and no longer taking narcotic pain medications.       Emotional Changes   It is normal to experience some mild sadness or emotional swings in the first week or two after delivery. If your symptoms do not get better after 2 weeks or you are experiencing worsening sadness, mood swings, thoughts of hurting yourself or others, including your baby, please call us right away. Postpartum depression is real and treatable, but it is easier to treat the earlier it is detected.      Travel   Travel is fine, but if on a long trip, get out and walk every 2 hours to maintain circulation. Often your feet and ankles will become swollen during the week after delivery. If you had IV fluids, you may swell for several weeks.        Exercise   Exercise is important in helping your body maintain strength and endurance, regaining abdominal muscle tone and returning to a healthy non-pregnant weight. Resume exercise as soon as you feel able. If aerobic exercise causes you pain or fatigue, stop for a few days and start back slowly when you resume.     When you resume aerobic exercise, ensure you are staying adequately hydrated (urine should be light yellow to clear) and consider breastfeeding just prior to exercise in order to avoid breast discomfort during physical activity.     Pelvic floor exercises (also known as Kegel exercises) can and should be initiated as  soon as you can comfortably perform them to enhance healing and strengthening of your pelvic floor (for both vaginal and Cesarean deliveries).      Employment   You may return to work 6 weeks after delivery. This is [place].  You may be eligible for Paid Family Leave. Consult https://paidfamilyleave.[place].gov/      Stitches and/or Hemorrhoids   Keep the perineal area clean with baths, showers, Sitz baths or the peri-bottle.   Americaine, Dermoplast, or similar topical treatments are available over the counter and can be applied as needed to stitches. If you have lacerations, they will heal within the next few weeks and the stitches will dissolve on their own.   Preparation H, Anusol and Tucks are fine for hemorrhoids.       Breast Care  If you are breastfeeding, remember that it is a new learning experience for you and your baby. Breastfeeding can come easily for some moms and for others will require patience and perseverance.   Here are some helpful tips to take care of your breasts:  • Wear comfortable supportive bras without underwires.  • Feed your baby on demand aiming for 8-12 times a day. Do not allow your baby to go longer than 6 hours without feeding.  • Rest and eat well so you have time and   energy to bond with and feed your baby.  • Make sure you have a comfortable, deep latch.  • If your breasts become engorged, you should breastfeed frequently, gently massage breasts, take a warm shower or a bath, use cold packs and/or warm packs and take ibuprofen and acetaminophen as instructed.  • For sore dry nipples, use some breast milk on nipples after feedings.  Lanolin or Hydrogel (silicone) pads may also help. If your nipples are very sore or cracked and bleeding, contact [facility] or your provider as we may need to help you find a more comfortable latch.  Plugged ducts are painful lumps in your breast where milk has become blocked. To relieve these, breastfeed frequently and alternate  positions. Apply warmth and massage before and during feedings.  Breast infections can occur whether you are nursing or not. Symptoms include fever, chills, and painful areas on your breasts. Usually antibiotics are needed. If you are nursing, keep nursing and contact your provider.  If you have breastfeeding questions or need assistance call [facility] [phone].  Or [facility] at [phone] ([phone]). Their practice is run by a local Pediatrician who specializes in breastfeeding. Their office is located at [address]. They hold a drop in breastfeeding group Wednesday nights 6-7:30pm.  Breast Feeding Supports:  [facility]IC - [place] [phone]  La Leche League: https://sites.google.com/site/LLLrochesterny  Website: Kellymom.com  [place] [facility]:  1. [place] [facility] at [facility] [address] 1st and 3rd Wednesday of the month from 1-3pm.  2. [place] [facility] at [facility] [address] in the [place] Room on the 3rd floor every Tuesday from 10:30 -12.  3. [place] [facility] at [facility] [place] [address] 1st and 4th Thursday of the month from 12:30-2pm.    If you are formula feeding only, wear a comfortable but supportive bra (no underwire). DO NOT bind your breasts which can lead to plugged ducts or infection. If your breasts feel full from pressure, stand in a hot shower and let the water run over your breasts, which can help milk to flow. Please note that, although you are allowing milk to flow, this will not increase your milk supply. Allowing some milk to flow will decrease the chance of plugged ducts, infections and serious engorgement. Apply ice packs for 10-20 minutes at a time to reduce swelling and pain. Take ibuprofen and acetaminophen as instructed.      Abdominal Cramps   "After birth pains" can be severe, especially with nursing and after the birth of the second or more child.  They rarely last more than 4 days.       Pain Medication   Ibuprofen or acetaminophen are both safe to take while nursing and are usually effective at managing cramps.       Vitamins and Iron   Continue prenatal vitamins throughout nursing or for 1 month if not nursing.     If your doctor instructed you to take iron, you should take it with 500 mg Vitamin C. You should continue the iron for 3 months.    If your doctor instructed you to take Vitamin D, you should take at least 600 international units per day.      Bathing   Showers and Sitz (shallow, lukewarm) baths are fine. Wait until your stitches/tears completely heal (for vaginal deliveries) or your incision has completely closed (Cesarean   deliveries) before taking deep baths or swimming. We recommend no douching.       Constipation   Stool softeners such as Colace, Metamucil, Senokot or generic are fine to use for mild constipation. Use milk of magnesia as necessary for persistent constipation.         Menstruation   Bleeding is expected for several weeks and up to 12 weeks is not abnormal. It may be red, blackish with clots. It changes to pink and then yellow-gray. Frequently, the discharge becomes red again. If the bleeding gets heavy again, please call your doctor or midwife.    The return of a real menstrual period varies. For non-nursing mothers, it is usually between 4-10 weeks. For nursing mothers, it can take over a year. Several months of periods are usually required before cycles become regular.       Sexual Intercourse   It takes varying times for stitches/tears to heal and for hemorrhoids and swelling to go away. Nursing also causes vaginal dryness and lubrication is often required. If your stitches/tears have healed and your bleeding has decreased, vaginal intercourse can be resumed as early as 2 weeks after delivery, as long as you feel emotionally and physically ready. Remember, the possibility of becoming pregnant exists, even while nursing. You  ovulate 2 weeks before the return of the first period, so you may become pregnant before your first period.        Contraception (birth control)  We recommend at least 18 months between pregnancies for you and your baby's health. Even if you are breastfeeding, you can become pregnant if you are not using a reliable and consistent form of contraception. We do not recommend using estrogen-containing products for the first several weeks after delivery due to the elevated risk of blood clots in your legs or lungs. Progesterone-only products (e.g., mini-pill or Depo-Provera), however, are perfectly safe immediately postpartum and do not interfere with breastfeeding.       Postpartum Visit   Call your doctor's office to schedule your appointment.   You will need your postpartum visit approximately 6 weeks after you delivered.      Call your doctor or midwife if you experience any of the following  1. Fever of 100.4 degrees or severe chills. Check your temperature before calling, if possible. You may feel hot, but you cannot determine if you have a true fever without a thermometer.   2. Excessively heavy vaginal bleeding (completely soaking through 1 pad or more per hour).  3. Extreme urinary frequency and burning with urination.  4. Swelling or tenderness in one area of the breast.  5. Severe headache that does not resolve with medication.   6. Chest pain or difficulty catching your breath that does not go away.  7. Severe abdominal pain, especially high up on your right side.   8. Severe swelling of your arms or legs or swelling in only one leg.  9. Marked depression or anxiety.

## 2018-11-15 ENCOUNTER — Other Ambulatory Visit: Payer: Self-pay

## 2018-11-15 DIAGNOSIS — O365990 Maternal care for other known or suspected poor fetal growth, unspecified trimester, not applicable or unspecified: Secondary | ICD-10-CM

## 2018-11-15 MED ORDER — ELECTRIC BREAST PUMP (FOR PERSONAL USE) *A*
0 refills | Status: AC
Start: 2018-11-15 — End: ?

## 2018-11-15 MED ORDER — ACETAMINOPHEN 325 MG PO TABS *I*
650.0000 mg | ORAL_TABLET | Freq: Four times a day (QID) | ORAL | 0 refills | Status: DC
Start: 2018-11-15 — End: 2019-04-21
  Filled 2018-11-15: qty 50, 6d supply, fill #0

## 2018-11-15 NOTE — Discharge Summary (Signed)
Name: Tyffani Foglesong MRN: [id number] DOB: January 24, 1995     Admit Date: 11/13/2018   Date of Discharge: 11/16/2018     Patient was accepted for discharge to   Home or Self Care [1]                  Hospitalization Summary    CONCISE NARRATIVE:   Otillia Cordone is a 23 y.o. G2P0010, who was admitted on 11/13/2018 at [redacted]w[redacted]d for IUGR.  Her pregnancy was complicated by IUGR, sickle cell trait, history of endocarditis.  During her labor, she received Miso for induction and she received Nubain for pain management. On 11/14/18 at [redacted]w[redacted]d, she delivered a viable female infant via spontaneous vaginal delivery.  Please see delivery note for full details.  The remainder of her hospital course was uncomplicated.  She plans to contracept with Depo.  She was discharged home in good condition after meeting all postpartum milestones.    Pt progressed pp meeting all milestons  She requested early discharge and approved by peds  Received depo in the hospital  Circ for son requested and completed  B/P 1356/64-127/67, no symptoms of PIH, given f/u appt in 1 1week to check B/P  Home with family                PENDING PATHOLOGY RESULTS: Placenta for IUGR           Signed: Juliane Poot, CNM  On: 11/15/2018  at: 12:24 PM

## 2018-11-15 NOTE — Lactation Note (Signed)
This note was copied from a baby's chart.  [facility] Lactation    Lactation Consultant Discharge Visit     Maternal Assessment    Breastfeeding: Well and Independently - per mother   Positioning: DNO. Newborn sleeping during [place] visit  Attachment: Good root, Lips Flanged, Jaw glide, Tongue down, Swallow and Nutritive suck - per mother  Comfortable with latch: yes    Nipple Assessment:        Condition: R-Normal and L-Normal        Nipple Shield: No     Breast Assessment:         Condition: R-Soft and L-Soft        Currently Pumping: No      Newborn Assessment    Newborn Name: Boy Krisher Sex: female GA: 68   Pediatrician: Linna Caprice, MD  Ped Phone: [phone]  Infant Weight: Birth Weight: 3033 g (6 lb 11 oz)   Today's Wt: 2953 g (6 lb 8.2 oz)(at 25 hours)   Percent Weight Loss: -2.64 %  F/V/S 24 hours:   Date 11/14/18 0700 - 11/15/18 0659 11/15/18 0700 - 11/16/18 0659   Shift 0700-1459 1500-2259 2300-0659 24 Hour Total 0700-1459 1500-2259 2300-0659 24 Hour Total   INTAKE   P.O.  25 43 68 18   18     P.O.  25 43 68 18   18   Breast Feeding             BF Occurrence 4 x 2 x  6 x         BF Attempts 1 x   1 x       Shift Total(mL/kg)  25(8.2) 43(14.2) 68(22.4) 18(6.1)   18(6.1)   OUTPUT   Urine(mL/kg/hr)             Urine Occurrence   3 x 3 x 1 x   1 x   Stool             Stool Occurrence  1 x 1 x 2 x       Shift Total(mL/kg)           NET  25 43 68 18   18   Weight (kg) 3 3 3 3 3 3 3 3       Any formula supplementation during hospital stay:  yes - per mother's request    Comments:  Baby's feeding, voids and stools are all within normal limits.  Reviewed goals with mother.     Information handouts given during hospital stay:  Breastfeeding Guide and Feeding Log  Engorgement  Paced Bottle Feeding  How to Know Newborn is Getting Enough/Milk Storage    Teaching    Signs of effective suckling/letdown, Signs of adequate infant intake (# of wet/stool diaper per 24 hrs), Resources for HELP, Breastfeeding Log Given,  Pumping/Storage, Introduction of bottles/pacifiers, Cluster Feeding and Insurance Information Regarding Pumps    Plan   Follow up: phone call by Lactation office and mom can call PRN.  Follow up Peds: 1-3 days     Metta Clines, RN, [facility]  Lactation Consultant

## 2018-11-15 NOTE — Progress Notes (Signed)
Postpartum Note CNM Attending RH + RI Had vaccines     S: Patient currently PPD#1 s/p spontaneous vaginal  delivery and states she is doing well. Pt is currently breastfeeding infant without issue. Pt reports Minimal lochia without clots. Pain is well controlled. She is ambulating, voiding and tolerating po intake without difficulty.     Pt plans depo for BCM postpartum. Pt reports currently feeling well and happy emotionally Pt is prepared for infant at home. Currently living with family.     Requests early discharge today    Abuse/Neglect Screen:  See SW notes    O:  Patient Vitals for the past 24 hrs:   BP Temp Temp src Pulse Resp SpO2   11/15/18 0925 123/70 36.9 C (98.4 F) TEMPORAL 73 17 95 %   11/15/18 0459 127/67 37 C (98.6 F) TEMPORAL 60 16 98 %   11/15/18 0039 135/64 37.1 C (98.8 F) TEMPORAL 68 20 96 %   11/14/18 1910 118/62 36.8 C (98.2 F) TEMPORAL 74 18 99 %   11/14/18 1522 115/60 36.7 C (98.1 F) TEMPORAL 65 16 95 %       General appearance: healthy  Breasts: R-Soft and R-Firm  Abdomen/Fundus:  Fundus firm at umbilicus -1 nontender  Perineum:normal  Lochia:Minimal  Rectum:normal  Extremities/Skin: Minimal edema of pedal and pretibial (1+)    Homans negativebilaterally    I/Os:  I/O last 3 completed shifts:  11/05 0700 - 11/06 0659  In: 1003.8 (15.3 mL/kg) [P.O.:360; I.V.:643.8 (0.4 mL/kg/hr)]  Out: 652 (9.9 mL/kg) [Urine:550 (0.3 mL/kg/hr); Blood:102]  Net: 351.8  Weight: 65.8 kg      LABS:  No results found for this or any previous visit (from the past 24 hour(s)).      A:   23 y.o. 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   PP d/c instructions from practice given and reviewed with pt. See orders and Patient Instructions    Med use reviewed encouraged to continue PNV's.    Plans Depo  for [facility] and given here Reviewed proper use of method and potential side effects.   Rev s/s PIH and when to call   Given appt next week with Dr. Marland Kitchen for B/P check    had Tdap vaccine.  had Flu vaccine.     Circumcision consent discussed/reviewed/obtained yes and completed    May d/c pt home PPD#1. Plan for PP exam in 4-6wks as discussed.  Juliane Poot, CNM

## 2018-11-15 NOTE — Discharge Summary (Addendum)
Name: Nichole Carter MRN: [id number] DOB: [birth year]     Admit Date: 11/13/2018   Date of Discharge: 11/16/2018     Patient was accepted for discharge to   Home or Self Care [1]                  Hospitalization Summary    CONCISE NARRATIVE:   Nichole Carter is a 23 y.o. G2P0010, who was admitted on 11/13/2018 at [redacted]w[redacted]d for IUGR.  Her pregnancy was complicated by IUGR, sickle cell trait, history of endocarditis.  During her labor, she received Miso for induction and she received Nubain for pain management. On 11/14/18 at [redacted]w[redacted]d, she delivered a viable female infant via spontaneous vaginal delivery.  Please see delivery note for full details.  The remainder of her hospital course was uncomplicated.  She plans to contracept with Depo.  She was discharged home in good condition after meeting all postpartum milestones.    Also see discharge summary by Jnasso                PENDING PATHOLOGY RESULTS: Placenta for IUGR           Signed: Zara Council, MD  On: 11/15/2018  at: 1:26 PM

## 2018-11-15 NOTE — Progress Notes (Signed)
[facility] VAGINAL DELIVERY PROGRESS NOTE   Postpartum Day: 1    Subjective     Nichole Carter is doing well, resting comfortably.  Pain is well-controlled with current regimen.  Tolerating regular diet without nausea/vomiting.  She has ambulated.  Denies chest pain, SOB, or lightheadedness.  Appropriate vaginal bleeding.  Baby is doing well, had lower temp overnight, did some skin to skin. In nursery now.       Objective     Vitals:    11/14/18 1522 11/14/18 1910 11/15/18 0039 11/15/18 0459   BP: 115/60 118/62 135/64 127/67   Pulse: 65 74 68 60   Resp: 16 18 20 16    Temp: 36.7 C (98.1 F) 36.8 C (98.2 F) 37.1 C (98.8 F) 37 C (98.6 F)   TempSrc: Temporal Temporal Temporal Temporal   SpO2: 95% 99% 96% 98%   Weight:       Height:           Mental Status: Alert and oriented x 3  Cardiovascular: Regular rate and rhythm with no murmurs  Respiratory: Clear to auscultate  Abdomen: Soft, appropriately tender, nondistended, +BS  Fundus: Fundus firm at umbilicus  Neurological: Not assessed  Extremities/Skin: No edema noted      Labs     Recent Labs   Lab 11/13/18  1343   Hematocrit 34       ABO RH Blood Type (no units)   Date Value   11/13/2018 O RH POS                    Rubella IgG AB (no units)   Date Value   05/13/2018 POSITIVE         Assessment & Plan     Nichole Carter is a 23 y.o. G2P1011 on PPD# 1 s/p Vaginal, Spontaneous  at [redacted]w[redacted]d complicated by IUGR, sickle cell trait, depression, and hx endocarditis.  Doing well.    Routine postpartum care:  - Rh status as above. Rhogam is not indicated.    - Infant: female. yes circumcision.  - Feeding: breast  - PPBC: Depo-Provera injections  - PPHct as above. Ferrous sulfate is not indicated on discharge.  - Immunizations: utd     No IP or PP complications.     Disposition: Plan for D/C home PPD # 2.    Nichole Potash, MD  Family Medicine PGY-1  11/15/18  6:27 AM

## 2018-11-15 NOTE — Progress Notes (Signed)
Pt discharged @ 1500.  Maternal/Newborn ID bands verified prior to departure.  Care plans, patient education, discharge instructions and follow up appointments reviewed.   No concerns or complaints were left unaddressed.   Signed- Derwood Becraft, RN

## 2018-11-16 IMAGING — CT CT RENAL STONE PROTOCOL
2 of 3 series · 15 of 43 positions shown, 17 images · non-contrast
Comparison: None.

CLINICAL DATA: Left flank pain and lower abdominal pain for 1 week.
White cell count 14.3. Red cells and white cells in urine.

EXAM:
CT ABDOMEN AND PELVIS WITHOUT CONTRAST
TECHNIQUE: Multidetector CT imaging of the abdomen and pelvis was performed
following the standard protocol without IV contrast.

[Series 3: lung · axial · 0.61mm/px · z∈[+1436,+1484]mm · 12 of 29 slices shown, 14 images]
[im 3/29  soft-tissue]
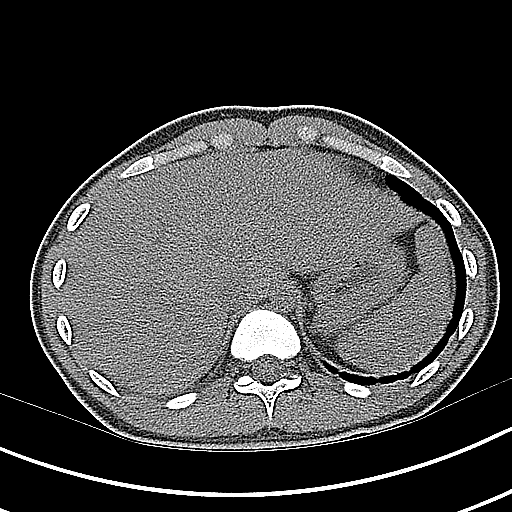
[im 3/29  bone]
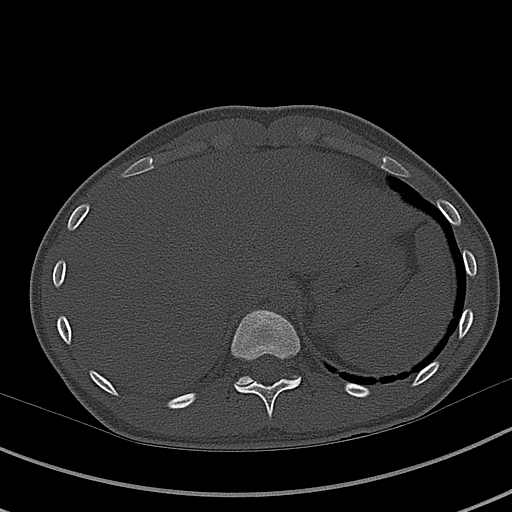
[im 5/29  soft-tissue]
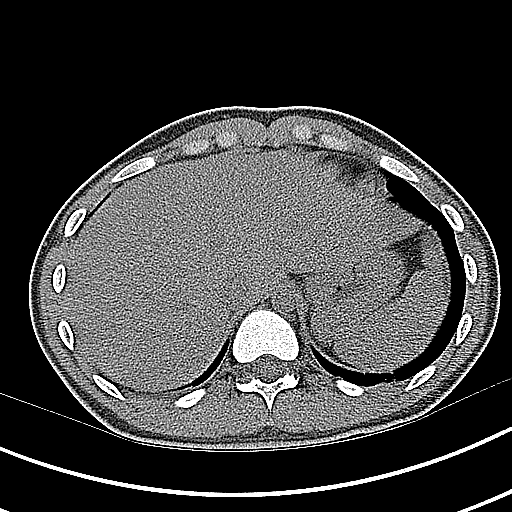
[im 7/29  soft-tissue]
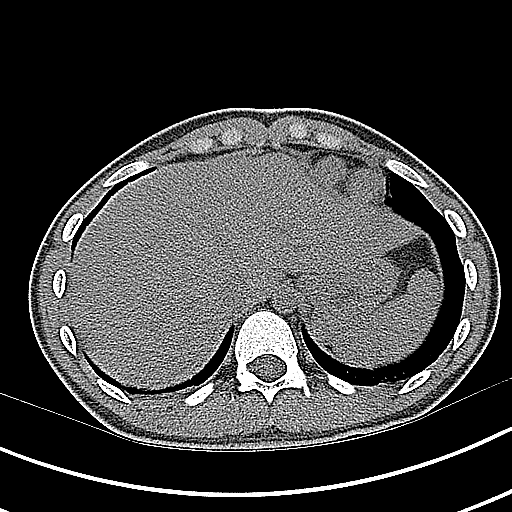
[im 9/29  soft-tissue]
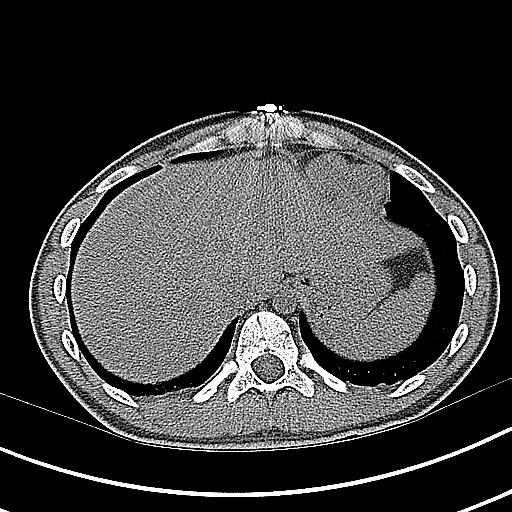
[im 12/29  soft-tissue]
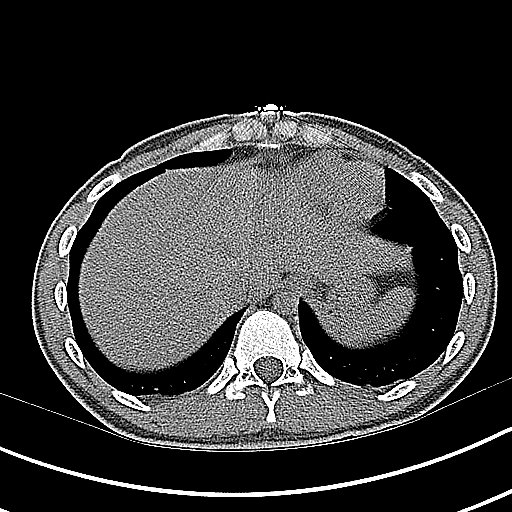
[im 14/29  soft-tissue]
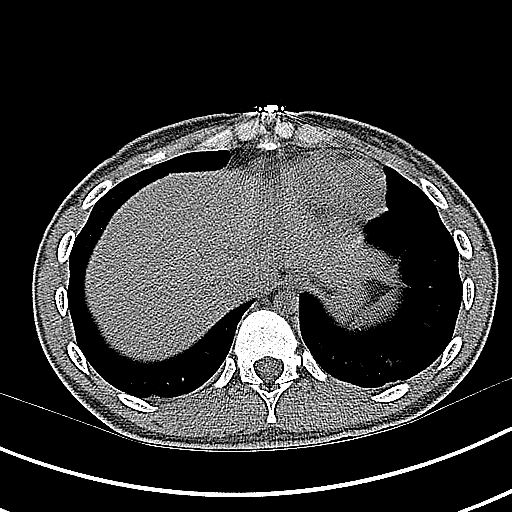
[im 16/29  soft-tissue]
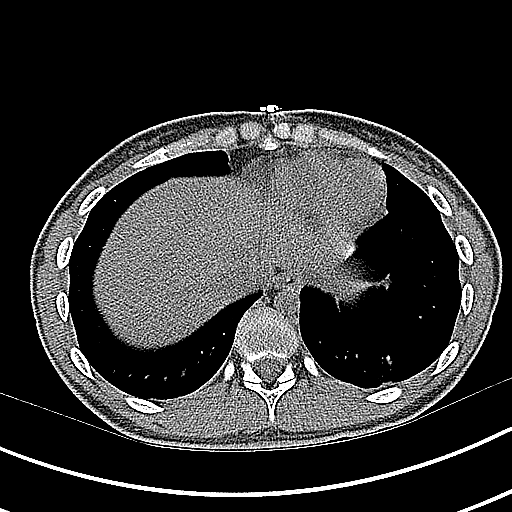
[im 18/29  soft-tissue]
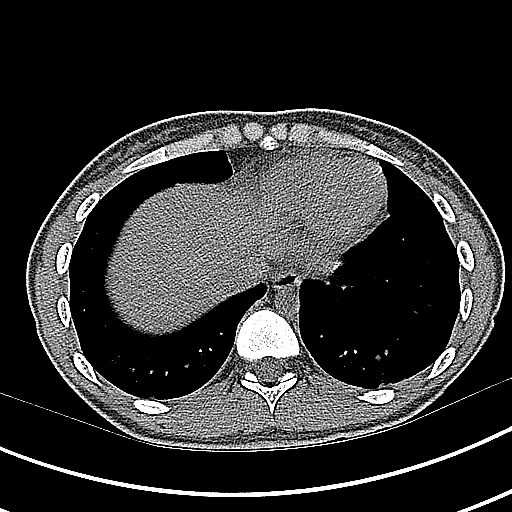
[im 21/29  soft-tissue]
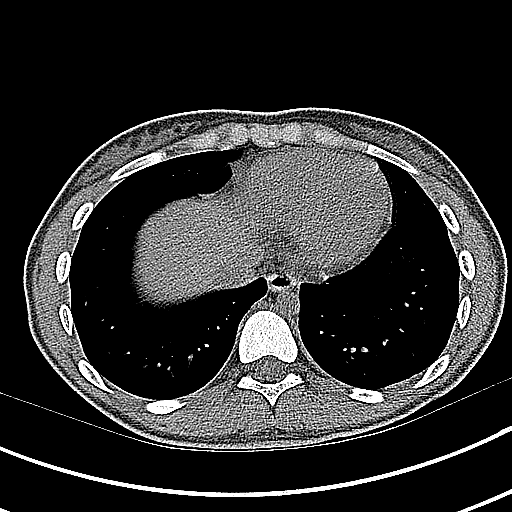
[im 21/29  bone]
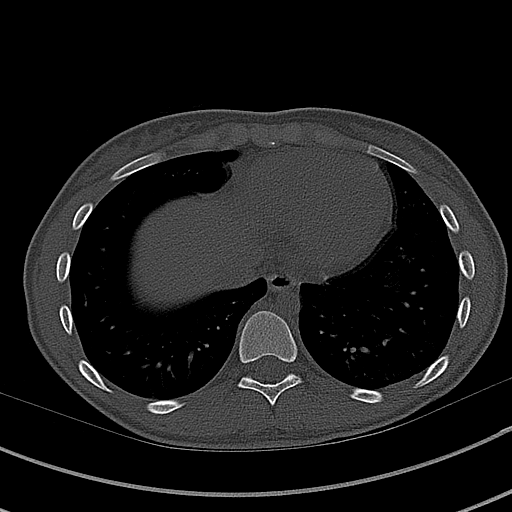
[im 23/29  soft-tissue]
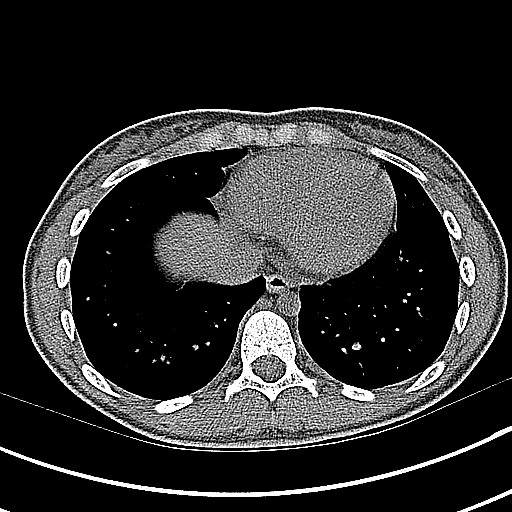
[im 25/29  soft-tissue]
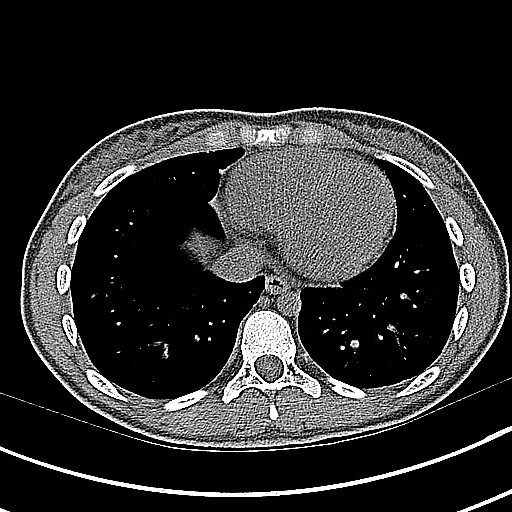
[im 27/29  soft-tissue]
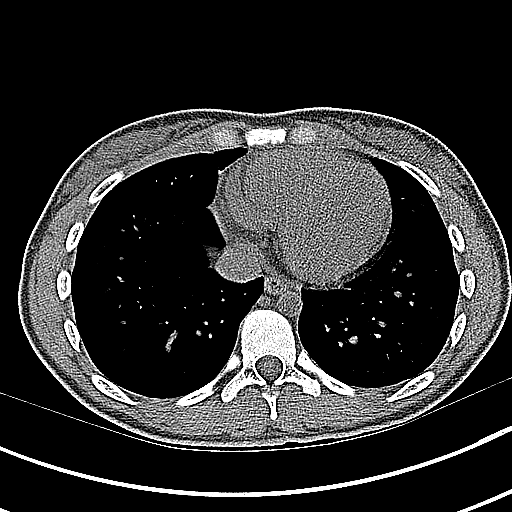

[Series 4: coronal · coronal · 0.57mm/px · 3 of 95 slices shown]
[im 32/95  soft-tissue]
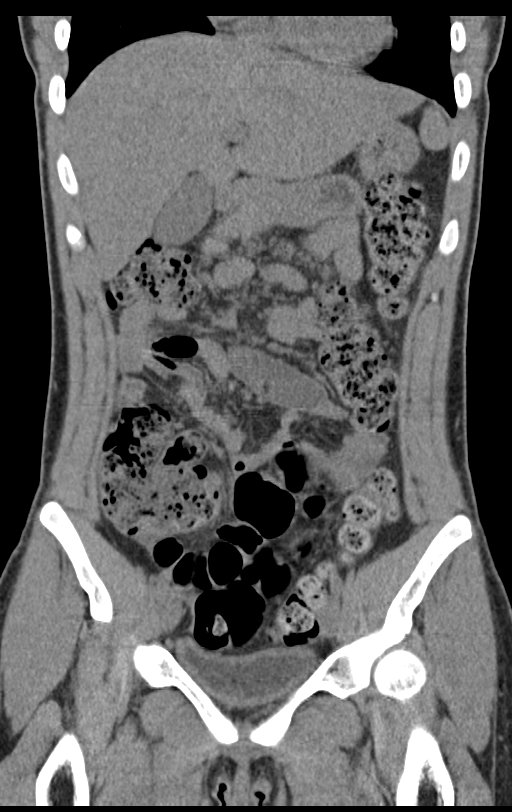
[im 42/95  soft-tissue]
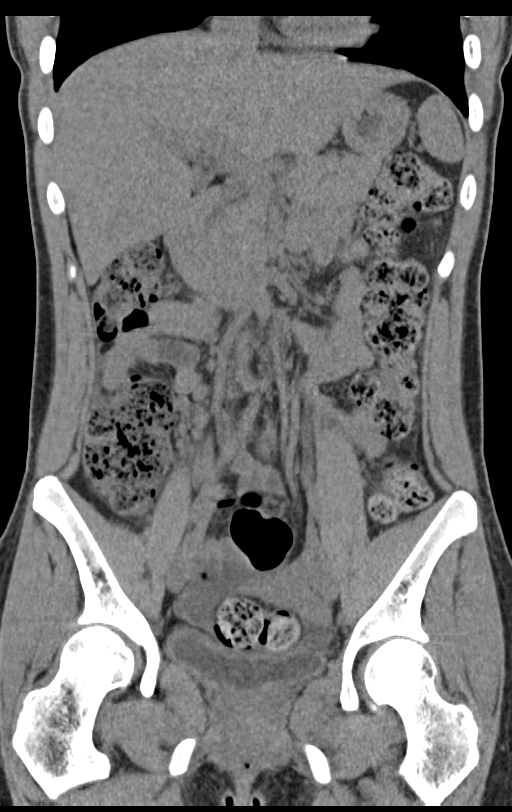
[im 53/95  soft-tissue]
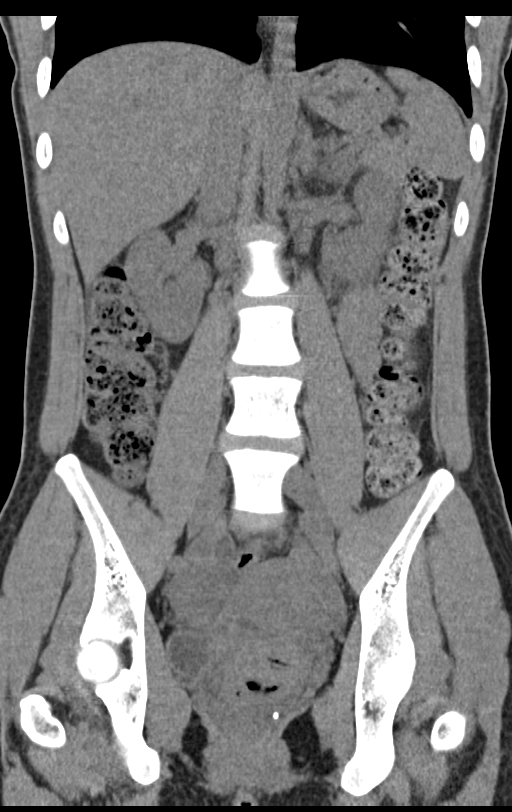

[15 of 43 positions shown; findings below may reference images not displayed]

FINDINGS: Lower chest: Lung bases are clear.

Hepatobiliary: No focal liver abnormality is seen. No gallstones,
gallbladder wall thickening, or biliary dilatation.

Pancreas: Unremarkable. No pancreatic ductal dilatation or
surrounding inflammatory changes.

Spleen: Normal in size without focal abnormality.

Adrenals/Urinary Tract: No adrenal gland nodules. Kidneys are
symmetrical in appearance. No renal or ureteral stones. No
hydronephrosis or hydroureter. Bladder wall is diffusely thickened
which may indicate cystitis. No filling defects.

Stomach/Bowel: Stomach, small bowel, and colon are not abnormally
distended. Stomach and small bowel are mostly decompressed. Colon is
stool filled. No inflammatory changes. Appendix is normal.

Vascular/Lymphatic: No significant vascular findings are present. No
enlarged abdominal or pelvic lymph nodes.

Reproductive: Uterus is not enlarged. Probable right ovarian cyst
measuring about 2 cm diameter. Suggestion of tubular fluid
collections in the right adnexum could represent hydrosalpinx.

Other: No free air or free fluid in the abdomen. Abdominal wall
musculature appears intact.

Musculoskeletal: No acute or significant osseous findings.
IMPRESSION: 1. No renal or ureteral stone or obstruction.
2. Bladder wall thickening may indicate cystitis.
3. Probable right ovarian cyst. Tubular fluid collections in the
right adnexum could represent hydrosalpinx.

## 2018-11-18 ENCOUNTER — Telehealth: Payer: Self-pay

## 2018-11-18 NOTE — Telephone Encounter (Signed)
Postpartum follow up call to Nichole Carter.  Pt is s/p SVD on 11/14/2018 which makes her 4 days post partum today.     Patient reports feeling okay. She reports a good support system at home.     Pt is both breastfeeding and bottle feeding. Her milk has come in. Infant is latching well. Pt denies s/sx of mastitis at either breast.    Pt reports her vaginal bleeding is light.    She is moving her bowels slowly. Advised she increase water intake and walk as she is able to. Also advised colace and miralax until bowels are regular. She doesn't want prescription, states she will obtain OTC.    Pt denies baby blues or depression.    Reviewed upcoming appointments:  11/22/2018 BP check.  Postpartum appointment scheduled for: 12/27/2018.    Advised to call the office with any questions or concerns that may arise.  Patient verbalized understanding.    Eula Fried, RN

## 2018-11-19 LAB — SURGICAL PATHOLOGY

## 2018-11-20 ENCOUNTER — Emergency Department
Admission: EM | Admit: 2018-11-20 | Discharge: 2018-11-20 | Discharge status: Against Medical Advice | Payer: Medicaid (Managed Care) | Attending: Emergency Medicine | Admitting: Emergency Medicine

## 2018-11-20 ENCOUNTER — Telehealth: Payer: Self-pay

## 2018-11-20 ENCOUNTER — Observation Stay: Payer: Medicaid (Managed Care)

## 2018-11-20 ENCOUNTER — Observation Stay
Admission: RE | Admit: 2018-11-20 | Discharge: 2018-11-20 | Disposition: A | Discharge status: Home / Self Care | Payer: Medicaid (Managed Care) | Source: Ambulatory Visit | Attending: Obstetrics and Gynecology | Admitting: Obstetrics and Gynecology

## 2018-11-20 DIAGNOSIS — R112 Nausea with vomiting, unspecified: Secondary | ICD-10-CM | POA: Insufficient documentation

## 2018-11-20 DIAGNOSIS — Z5321 Procedure and treatment not carried out due to patient leaving prior to being seen by health care provider: Secondary | ICD-10-CM | POA: Insufficient documentation

## 2018-11-20 DIAGNOSIS — H53149 Visual discomfort, unspecified: Secondary | ICD-10-CM | POA: Insufficient documentation

## 2018-11-20 DIAGNOSIS — O9089 Other complications of the puerperium, not elsewhere classified: Principal | ICD-10-CM | POA: Insufficient documentation

## 2018-11-20 DIAGNOSIS — R519 Headache, unspecified: Secondary | ICD-10-CM | POA: Insufficient documentation

## 2018-11-20 DIAGNOSIS — Z20828 Contact with and (suspected) exposure to other viral communicable diseases: Secondary | ICD-10-CM | POA: Insufficient documentation

## 2018-11-20 DIAGNOSIS — R1031 Right lower quadrant pain: Secondary | ICD-10-CM | POA: Insufficient documentation

## 2018-11-20 LAB — CBC AND DIFFERENTIAL
Baso # K/uL: 0 10*3/uL (ref 0.0–0.1)
Basophil %: 0.1 %
Eos # K/uL: 0.1 10*3/uL (ref 0.0–0.4)
Eosinophil %: 1.1 %
Hematocrit: 35 % (ref 34–45)
Hemoglobin: 11.4 g/dL (ref 11.2–15.7)
IMM Granulocytes #: 0 10*3/uL (ref 0.0–0.0)
IMM Granulocytes: 0.4 %
Lymph # K/uL: 1.7 10*3/uL (ref 1.2–3.7)
Lymphocyte %: 15 %
MCH: 27 pg (ref 26–32)
MCHC: 33 g/dL (ref 32–36)
MCV: 81 fL (ref 79–95)
Mono # K/uL: 0.5 10*3/uL (ref 0.2–0.9)
Monocyte %: 4.7 %
Neut # K/uL: 8.8 10*3/uL — ABNORMAL HIGH (ref 1.6–6.1)
Nucl RBC # K/uL: 0 10*3/uL (ref 0.0–0.0)
Nucl RBC %: 0 /100 WBC (ref 0.0–0.2)
Platelets: 342 10*3/uL (ref 160–370)
RBC: 4.3 MIL/uL (ref 3.9–5.2)
RDW: 14.6 % — ABNORMAL HIGH (ref 11.7–14.4)
Seg Neut %: 78.7 %
WBC: 11.2 10*3/uL — ABNORMAL HIGH (ref 4.0–10.0)

## 2018-11-20 LAB — COMPREHENSIVE METABOLIC PANEL
ALT: 24 U/L (ref 0–35)
AST: 21 U/L (ref 0–35)
Albumin: 3.8 g/dL (ref 3.5–5.2)
Alk Phos: 153 U/L — ABNORMAL HIGH (ref 35–105)
Anion Gap: 14 (ref 7–16)
Bilirubin,Total: 0.4 mg/dL (ref 0.0–1.2)
CO2: 23 mmol/L (ref 20–28)
Calcium: 9.1 mg/dL (ref 9.0–10.4)
Chloride: 105 mmol/L (ref 96–108)
Creatinine: 0.75 mg/dL (ref 0.51–0.95)
GFR,Black: 130 *
GFR,Caucasian: 113 *
Glucose: 76 mg/dL (ref 60–99)
Lab: 4 mg/dL — ABNORMAL LOW (ref 6–20)
Potassium: 3.7 mmol/L (ref 3.3–5.1)
Sodium: 142 mmol/L (ref 133–145)
Total Protein: 6.4 g/dL (ref 6.3–7.7)

## 2018-11-20 MED ORDER — LACTATED RINGERS IV BOLUS *I*
1000.0000 mL | Freq: Once | INTRAVENOUS | Status: AC
Start: 2018-11-20 — End: 2018-11-20
  Administered 2018-11-20: 1000 mL via INTRAVENOUS

## 2018-11-20 MED ORDER — ACETAMINOPHEN 500 MG PO TABS *I*
1000.0000 mg | ORAL_TABLET | Freq: Once | ORAL | Status: AC
Start: 2018-11-20 — End: 2018-11-20
  Administered 2018-11-20: 1000 mg via ORAL
  Filled 2018-11-20: qty 2

## 2018-11-20 MED ORDER — LACTATED RINGERS IV SOLN *I*
125.0000 mL/h | INTRAVENOUS | Status: DC
Start: 2018-11-20 — End: 2018-11-21

## 2018-11-20 MED ORDER — DEXTROSE 5 % FLUSH FOR PUMPS *I*
0.0000 mL/h | INTRAVENOUS | Status: DC | PRN
Start: 2018-11-20 — End: 2018-11-21

## 2018-11-20 MED ORDER — ONDANSETRON HCL 2 MG/ML IV SOLN *I*
4.0000 mg | Freq: Once | INTRAMUSCULAR | Status: AC
Start: 2018-11-20 — End: 2018-11-20
  Administered 2018-11-20: 4 mg via INTRAVENOUS
  Filled 2018-11-20: qty 2

## 2018-11-20 MED ORDER — SODIUM CHLORIDE 0.9 % FLUSH FOR PUMPS *I*
0.0000 mL/h | INTRAVENOUS | Status: DC | PRN
Start: 2018-11-20 — End: 2018-11-21

## 2018-11-20 NOTE — OB Triage Note (Signed)
Pt d/c;d home.  States she is feeling better.  Dr. Terance Hart in to see pt.

## 2018-11-20 NOTE — OB Triage Note (Signed)
Patient reports to triage with complaint of headache, blurred vision, abdominal pain, and vomiting. Patient reports the headache and blurred vision began 2-3days after delivery. Patient reports taking tylenol with minimal relief. Patient states that the abdominal pain and vomiting began 2 days ago. Patient reports since she has been unable to eat and drink without vomiting. Denies fever, cough, or SOB. Patients she's only been in the house since delivery and has had no contact with anyone with COVID to her knowledge. De Burrs, RN

## 2018-11-20 NOTE — Telephone Encounter (Signed)
Incoming call from the patient. Advised she report to the ED for the initial evaluation and the patient is agreeable.    Wannetta Sender, RN

## 2018-11-20 NOTE — Telephone Encounter (Signed)
Received call from the pt. She reports she is in excruciating pain. She also feels nauseous. She has a minor headache. No swelling. She reports feeling really dizzy. Advised she report to triage. She has someone to drive her.    Report called to triage, per Judson Roch- pt should report to ED.    Call placed to pt. No answer. Left message on the pt's voice mail to call the nurses' line back, phone number provided.    Eula Fried, RN
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
## 2018-11-20 NOTE — Discharge Instructions (Addendum)
Please return to triage if headache returns or get worse for further evaluation, or develops blurry vision, or weakness in limbs

## 2018-11-21 ENCOUNTER — Telehealth: Payer: Self-pay

## 2018-11-21 LAB — COVID-19 NAAT (PCR): COVID-19 NAAT (PCR): NEGATIVE

## 2018-11-21 LAB — COVID-19 PCR

## 2018-11-21 NOTE — Telephone Encounter (Signed)
Lactation follow up phone call complete.  Mother reached  BF: well per mother  Breasts:  Engorged; Remedies discussed  Nipples: Intact & Sore; milk to air dry on nipples, hand expression before feeding, suck training if possible  Pumping yes 2x a day on her own for full breasts;encouraged to only pump for 5" before a feed to help latch if needed unless directed differently by pediatrician  Supplementing no  Peds visit gaining weight    C.Titus Dubin RN,CLC

## 2018-11-22 ENCOUNTER — Ambulatory Visit: Payer: Medicaid (Managed Care) | Admitting: Obstetrics & Gynecology

## 2018-11-22 DIAGNOSIS — F32A Depression, unspecified: Secondary | ICD-10-CM

## 2018-11-22 DIAGNOSIS — F329 Major depressive disorder, single episode, unspecified: Secondary | ICD-10-CM

## 2018-11-22 NOTE — Progress Notes (Signed)
[facility] Visit  Location: [place]     Subjective     Nichole Carter is a 23 y.o. [phone] who presents for postpartum follow up. She had an uncomplicated SVD on 70/2 after IOL for IUGR. She had blood pressures that were in the high normal range prior to discharge and was scheudled for a blood pressure check today. She was seen in triage on 11/11 with dizziness and GI symptoms with normal blood pressures and COVID swab was negative. She states that GI symptoms are resolved. Denies headache, vision changes, SOB, abdominal pain. Bleeding is mostly resolved. Feels like things are going well at home. Has support from multiple family members. She has been able to get out of the house. She does find that she wakes easily when her son makes a noise and sometimes has trouble getting back to sleep, and she worries about her son when she is out without him--calling or texting to check in on him often. She denies any concerns about harming him or herself and feels that these deelings are not interfering with her day.     Patient's medications, allergies, past medical, surgical, obstetrical, gynecologic, social and family histories were reviewed and updated as appropriate.      Objective     BP 120/62    Pulse 101    Temp 36.6 C (97.8 F)    Wt 62.1 kg (136 lb 12.8 oz)    LMP 02/14/2018    SpO2 99%    BMI 21.43 kg/m      General: well developed and well nourished, alert and oriented, in no acute distress, appropriate in conversation, does not appear anxious  Respirations nonlabored, normal rate      Assessment     Nichole Carter is an 23 y.o. G2P1011 presenting to the office for postpartum follow up.     1. NSVD (normal spontaneous vaginal delivery)           Plan     -reviewed normal postpartum mood changes. Advised to call if she is feeling more anxious, finding it difficult to get out of the house or complete tasks  -BP is normal and symptoms are improved    Dispo:  She will follow-up  for PPV 12/18.        Unk Lightning, MD 11/22/2018 10:50 PM
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
## 2018-12-27 ENCOUNTER — Encounter: Payer: Self-pay | Admitting: Women's Health

## 2018-12-27 ENCOUNTER — Ambulatory Visit: Payer: Medicaid (Managed Care) | Admitting: Women's Health

## 2018-12-27 DIAGNOSIS — N921 Excessive and frequent menstruation with irregular cycle: Secondary | ICD-10-CM

## 2018-12-27 DIAGNOSIS — Z3042 Encounter for surveillance of injectable contraceptive: Secondary | ICD-10-CM

## 2018-12-30 MED ORDER — TRANEXAMIC ACID 650 MG PO TABS *I*
650.0000 mg | ORAL_TABLET | Freq: Three times a day (TID) | ORAL | 0 refills | Status: DC
Start: 2018-12-30 — End: 2019-04-11

## 2019-04-02 ENCOUNTER — Other Ambulatory Visit: Payer: Self-pay | Admitting: Pulmonary Disease

## 2019-04-02 DIAGNOSIS — Z23 Encounter for immunization: Secondary | ICD-10-CM

## 2019-04-11 ENCOUNTER — Other Ambulatory Visit
Admission: RE | Admit: 2019-04-11 | Discharge: 2019-04-11 | Disposition: A | Discharge status: Home / Self Care | Payer: Medicaid (Managed Care) | Source: Ambulatory Visit

## 2019-04-11 ENCOUNTER — Ambulatory Visit: Payer: Medicaid (Managed Care) | Attending: Women's Health | Admitting: Women's Health

## 2019-04-11 VITALS — BP 106/58 | HR 79 | Temp 97.2°F | Wt 130.4 lb

## 2019-04-11 DIAGNOSIS — A749 Chlamydial infection, unspecified: Secondary | ICD-10-CM | POA: Insufficient documentation

## 2019-04-11 DIAGNOSIS — N921 Excessive and frequent menstruation with irregular cycle: Secondary | ICD-10-CM

## 2019-04-11 DIAGNOSIS — N939 Abnormal uterine and vaginal bleeding, unspecified: Secondary | ICD-10-CM | POA: Insufficient documentation

## 2019-04-11 DIAGNOSIS — Z3009 Encounter for other general counseling and advice on contraception: Secondary | ICD-10-CM | POA: Insufficient documentation

## 2019-04-11 LAB — CBC
Hematocrit: 36 % (ref 34–45)
Hemoglobin: 11.8 g/dL (ref 11.2–15.7)
MCH: 27 pg (ref 26–32)
MCHC: 32 g/dL (ref 32–36)
MCV: 84 fL (ref 79–95)
Platelets: 274 10*3/uL (ref 160–370)
RBC: 4.4 MIL/uL (ref 3.9–5.2)
RDW: 13.5 % (ref 11.7–14.4)
WBC: 6.7 10*3/uL (ref 4.0–10.0)

## 2019-04-11 LAB — POCT URINE PREGNANCY
Lot #: 9112096
Preg Test,UR POC: NEGATIVE

## 2019-04-11 LAB — TSH: TSH: 0.9 u[IU]/mL (ref 0.27–4.20)

## 2019-04-11 MED ORDER — NORELGESTROMIN-ETH ESTRADIOL 150-35 MCG/24HR TD PTWK *I*
1.0000 | MEDICATED_PATCH | TRANSDERMAL | 12 refills | Status: AC
Start: 2019-04-11 — End: ?

## 2019-04-11 NOTE — Progress Notes (Signed)
GYN Visit    CC: Birth control counseling   S: Nichole Carter is a 24 y.o. G2P1011 was scheduled for a nuvaring insertion. Upon discussion with pt she expressed that she was interested in a birth control that lasted for 12 years that she has had previously. She thought it was called the NuvaRing.   Upon further discussion it was clarified that she was interested in an IUD.  She had the copper IUD in the past.  She reports that since her delivery she has continued to have intermittent bleeding and spotting.  She was given Depo prior to leaving the hospital.  At her postpartum visit on 12/27/2018 she reported that she was continuing to have bleeding.  She was prescribed Lysteda to be taken 3 times daily for 5 days.  She reports she did take this medication but it did not help with her bleeding.  At that time she was also advised to have her blood drawn to check a CBC and was asked to call back if bleeding did not cease.  She did not call the office back.   She reports that since that time she has had spotting continuously in March she had 2 days of heavy bleeding but did not have any cramping or bloating which she usually has with her menses.     The bleeding became frustrating and she felt very tired so she decided to try the patch which she had a leftover prescription for. She used 1 week and put on a second patch which then fell off 2 to 3 days later.   This was from the 12th through 21 March.   This past week or so she has had very light spotting.  She did have intercourse on 27 March and did not use any form of birth control including condoms.    She is continuing to take her iron she denies any fevers or chills or abdominal pain.    Before her pregnancy her periods would last 2-3 days.  She would fill a super plus tampon in 2 hours.     Her goal with her birth control is something that she does not have to remember to talk and that will control her bleeding.     She was on Depo on and off from age [age] on for 5 yerars     Then had the IUD placed 06/15/15  And removed 12/02/15 it was bothersome with IC and she had very heavy bleeding.  Then stopped birth control all together   July 2019 had Termination   2020 got back on the depo   She is not constant with taking pills      Patient's medications, allergies, past medical, surgical, social and family histories were reviewed and updated as appropriate.    GYN Hx:   Patient's last menstrual period was 03/13/2019 (approximate).  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 female [id number] presenting to the office for birth control counseling and AUB  1. Birth control counseling  norelgestromin-ethinyl estradiol Marilu Favre) 150-35 MCG/24HR patch   2. Abnormal uterine bleeding (AUB)  Gyn ultrasound scan    TSH    Chlamydia plasmid DNA amplification    N. Gonorrhoeae DNA amplification    Trichomonas DNA amplification    Trichomonas DNA amplification    N. Gonorrhoeae DNA amplification    Chlamydia plasmid DNA amplification    POCT urine pregnancy         P:  1. Abnormal uterine bleeding (AUB)  - Advised that we get an ultrasound to see if there is any structural cause of the continued bleeding   - STI cultures to rule out infection   - Discussed having blood work drawn to assess for anemia and thyroid     - Gyn ultrasound scan; Future  - TSH; Future  - Chlamydia plasmid DNA amplification; Future  - N. Gonorrhoeae DNA amplification; Future  - Trichomonas DNA amplification; Future  - POCT urine  pregnancy - neg    2. Birth control counseling    After discussion with pt regarding her goals she has decided that she would like the Mirena IUD. We discussed a quick start today however due to recent unprotected IC, not knowing the exact date of her LMP and being outside the window for emergency contraception she decided that she would like to hold off to make sure she is not pregnant before having it placed.     She will take a pregnancy test in 2 weeks and use the patch in the meantime   Advised no unprotected IC before insertion     - norelgestromin-ethinyl estradiol Marilu Favre) 150-35 MCG/24HR patch; Apply 1 patch onto the skin once a week Use for 3 weeks, followed by 1 patch-free week.  Dispense: 3 patch; Refill: 12       RTC for Pelvic USN and IUD insert     I personally spent a total of 45 minutes on the calendar day of encounter on pre-visit work, face time, and post-visit work.        Audie Box, NP   04/11/2019

## 2019-04-12 LAB — TRICHOMONAS DNA AMPLIFICATION: Trichomonas DNA amplification: 0

## 2019-04-12 LAB — CHLAMYDIA PLASMID DNA AMPLIFICATION: Chlamydia Plasmid DNA Amplification: POSITIVE — AB

## 2019-04-12 LAB — N. GONORRHOEAE DNA AMPLIFICATION: N. gonorrhoeae DNA Amplification: 0

## 2019-04-14 ENCOUNTER — Telehealth: Payer: Self-pay

## 2019-04-14 MED ORDER — AZITHROMYCIN 250 MG PO TABS *I*
1000.0000 mg | ORAL_TABLET | Freq: Once | ORAL | 1 refills | Status: AC
Start: 2019-04-14 — End: 2019-04-14

## 2019-04-14 NOTE — Telephone Encounter (Signed)
Spoke with pt. She is aware of pos chlamydia culture. Aware that med has been sent to pharmacy and she needs to take asap. She is in [place] and will pick it up no later thane Wednesday.  Aware of need to inform partner and have him tested and treated as well.  No intercourse for 14 days after both have been treated and recommended condom use faithfully

## 2019-04-14 NOTE — Addendum Note (Signed)
Addended by: Olin Hauser on: 04/14/2019 11:54 AM     Modules accepted: Orders

## 2019-04-14 NOTE — Telephone Encounter (Signed)
-----   Message from [facility], NP sent at 04/14/2019 11:54 AM EDT -----  Positive results for Chlamydia  Drug allergies reviewed NKDA  Please review the following with patient   RX for Azithromycin 1 g orally in single dose ordered to pharmacy on file   Patient's partner(s) need testing and treatment - refill placed for expedited partner therapy   Patient should refrain from sexual intercourse for 14 days after treatment is completed.  Please recommend 100% condom use for STI prevention in the future     Chloe Ronne Binning, NP

## 2019-04-21 ENCOUNTER — Telehealth: Payer: Self-pay

## 2019-04-21 MED ORDER — AZITHROMYCIN 500 MG PO TABS *I*
1000.0000 mg | ORAL_TABLET | Freq: Once | ORAL | 1 refills | Status: AC
Start: 2019-04-21 — End: 2019-04-21

## 2019-04-21 NOTE — Addendum Note (Signed)
Addended by: Doneta Public on: 04/21/2019 02:34 PM     Modules accepted: Orders

## 2019-04-21 NOTE — Telephone Encounter (Signed)
Pt was pos for chlamydia 04/14/19 Pt was notified and zithromax sent to [facility]. Pt was aware ans was to pick it up 04/15/19  Call to pharmacy--pt has never picked up script.  Called placed to pt. She states she "has been out of town and I didn't come home to get it."  Explained that she needs treatment for this infection ASAP.  Explained the importance of treatment.   Asked where we could send it as she is out of town--She asked it be sent to [facility] on [address]--(in [facility])    Message to providers to resend zithromax

## 2019-04-23 ENCOUNTER — Telehealth: Payer: Self-pay

## 2019-04-23 ENCOUNTER — Encounter: Payer: Self-pay | Admitting: Gastroenterology

## 2019-04-23 NOTE — Telephone Encounter (Signed)
Pt was pos for chlamydia 04/11/19  Zithromax was sent to local pharmacy and pt informed.  She did not pick up med as of 4/12 so pt contacted and script sent to [facility]. Pt was to pick up that day. Call to pharmacy--script still waiting for pickup.  Has appt 4/23 for IUD insertion.   Pt needs to be 2 weeks after treatment completion to have IUD inserted--will need to change appt  Call to pt but no answer  LMTCB

## 2019-04-24 NOTE — Telephone Encounter (Signed)
Spoke with pt. She did pick up Zithromax script for treatment of chlamydia but not until the 14.  She was told IUD insertion needs to be rescheduled a she needs to wait at least 14 days after treatment to have IUD placed. Appt 4/23/ cancelled and pt transferred to secretaries to reschedule appt

## 2019-04-25 ENCOUNTER — Other Ambulatory Visit: Payer: Medicaid (Managed Care)

## 2019-05-02 ENCOUNTER — Ambulatory Visit: Payer: Medicaid (Managed Care) | Admitting: Women's Health

## 8386-09-10 DEATH — deceased
